# Patient Record
Sex: Male | Born: 1955 | Race: White | Hispanic: No | Marital: Single | Smoking: Former smoker
Health system: Southern US, Community
[De-identification: ages and names within clinical notes are randomized; demographics above are authoritative.]

## PROBLEM LIST (undated history)

## (undated) DIAGNOSIS — N200 Calculus of kidney: Secondary | ICD-10-CM

## (undated) DIAGNOSIS — E7800 Pure hypercholesterolemia, unspecified: Secondary | ICD-10-CM

## (undated) DIAGNOSIS — N400 Enlarged prostate without lower urinary tract symptoms: Secondary | ICD-10-CM

## (undated) DIAGNOSIS — E119 Type 2 diabetes mellitus without complications: Secondary | ICD-10-CM

---

## 2012-08-30 ENCOUNTER — Encounter (HOSPITAL_COMMUNITY): Payer: Self-pay | Admitting: Family Medicine

## 2012-08-30 ENCOUNTER — Emergency Department (HOSPITAL_COMMUNITY)
Admission: EM | Admit: 2012-08-30 | Discharge: 2012-08-30 | Disposition: A | Discharge status: Home / Self Care | Payer: Self-pay | Attending: Emergency Medicine | Admitting: Emergency Medicine

## 2012-08-30 ENCOUNTER — Emergency Department (HOSPITAL_COMMUNITY): Payer: Self-pay

## 2012-08-30 DIAGNOSIS — W108XXA Fall (on) (from) other stairs and steps, initial encounter: Secondary | ICD-10-CM | POA: Insufficient documentation

## 2012-08-30 DIAGNOSIS — Y929 Unspecified place or not applicable: Secondary | ICD-10-CM | POA: Insufficient documentation

## 2012-08-30 DIAGNOSIS — S298XXA Other specified injuries of thorax, initial encounter: Secondary | ICD-10-CM | POA: Insufficient documentation

## 2012-08-30 DIAGNOSIS — Y939 Activity, unspecified: Secondary | ICD-10-CM | POA: Insufficient documentation

## 2012-08-30 DIAGNOSIS — Z79899 Other long term (current) drug therapy: Secondary | ICD-10-CM | POA: Insufficient documentation

## 2012-08-30 DIAGNOSIS — R0789 Other chest pain: Secondary | ICD-10-CM

## 2012-08-30 MED ORDER — CYCLOBENZAPRINE HCL 10 MG PO TABS: 10.0000 mg | ORAL_TABLET | Freq: Three times a day (TID) | ORAL | Status: DC | PRN

## 2012-08-30 MED ORDER — OXYCODONE-ACETAMINOPHEN 5-325 MG PO TABS: 1.0000 | ORAL_TABLET | ORAL | Status: DC | PRN

## 2012-08-30 NOTE — ED Notes (Signed)
Pt back from x-ray.  States pain improved w oxycodone he took from friend.

## 2012-08-30 NOTE — ED Provider Notes (Signed)
History     CSN: [id number]  Arrival date & time 08/30/12  1228   First MD Initiated Contact with Patient 08/30/12 1307      Chief Complaint  Patient presents with  . Rib Injury    (Consider location/radiation/quality/duration/timing/severity/associated sxs/prior treatment) Patient is a 57 y.o. male presenting with chest pain. The history is provided by the patient.  Chest Pain The chest pain began 1 - 2 weeks ago. The quality of the pain is described as sharp. Pertinent negatives for primary symptoms include no fever, no shortness of breath, no cough and no abdominal pain. Associated symptoms comments: He fell one week ago causing right sided chest wall pain that he states was significantly improved until yesterday when he sneezed and had sudden onset of recurrent sharp pain in the same region as the previous fall. No SOB, but reports it is painful to breathe. No fever, cough or hemoptysis. No abdominal pain.Marland Kitchen     History reviewed. No pertinent past medical history.  History reviewed. No pertinent past surgical history.  History reviewed. No pertinent family history.  History  Substance Use Topics  . Smoking status: Never Smoker   . Smokeless tobacco: Not on file  . Alcohol Use: No      Review of Systems  Constitutional: Negative for fever.  HENT: Negative for neck pain.   Respiratory: Negative for cough and shortness of breath.   Cardiovascular: Positive for chest pain.  Gastrointestinal: Negative for abdominal pain.    Allergies  Review of patient's allergies indicates no known allergies.  Home Medications   Current Outpatient Rx  Name  Route  Sig  Dispense  Refill  . CHROMIUM PO   Oral   Take 1 tablet by mouth daily.         Marland Kitchen GINSENG PO   Oral   Take 1 capsule by mouth daily.         Marland Kitchen HYDROXYCHLOROQUINE SULFATE 200 MG PO TABS   Oral   Take 200 mg by mouth daily.         Marland Kitchen METFORMIN HCL 500 MG PO TABS   Oral   Take 250 mg by mouth every  evening.         Marland Kitchen MILK THISTLE PO   Oral   Take 1 capsule by mouth daily.         . ADULT MULTIVITAMIN W/MINERALS CH   Oral   Take 1 tablet by mouth daily.         Marland Kitchen FISH OIL PO   Oral   Take 1 capsule by mouth daily.         . OXYCODONE HCL PO   Oral   Take 1 tablet by mouth once.           BP 161/93  Pulse 102  Temp 98 F (36.7 C)  Resp 18  SpO2 96%  Physical Exam  Constitutional: He is oriented to person, place, and time. He appears well-developed and well-nourished.  HENT:  Head: Normocephalic.  Neck: Normal range of motion. Neck supple.  Cardiovascular: Normal rate and regular rhythm.   Pulmonary/Chest: Effort normal and breath sounds normal. He has no wheezes. He has no rales. He exhibits tenderness.       Chest wall unremarkable in appearance without bruising or swelling. Mildly tender over right anterior and lateral chest wall. No bony deformity.   Abdominal: Soft. Bowel sounds are normal. There is no tenderness. There is no rebound and no guarding.  Musculoskeletal: Normal range of motion.  Neurological: He is alert and oriented to person, place, and time.  Skin: Skin is warm and dry. No rash noted.  Psychiatric: He has a normal mood and affect.    ED Course  Procedures (including critical care time)  Labs Reviewed - No data to display Dg Ribs Unilateral W/chest Right  08/30/2012  *RADIOLOGY REPORT*  Clinical Data: Larey Seat 1 week ago.  Chest pain  RIGHT RIBS AND CHEST - 3+ VIEW  Comparison: None  Findings: The lungs are clear without infiltrate or effusion.  No pneumothorax.  Negative for heart failure.  Negative for right rib fracture.  IMPRESSION: No acute abnormality.   Original Report Authenticated By: Janeece Riggers, M.D.      No diagnosis found.   1. Chest wall pain  MDM  Chest x-ray negative for any rib fractures or lung abnormalities. Abdomen completely nontender. Will treat for muscular chest wall pain.        Arnoldo Hooker,  PA-C 08/30/12 1442

## 2012-08-30 NOTE — ED Provider Notes (Signed)
Medical screening examination/treatment/procedure(s) were performed by non-physician practitioner and as supervising physician I was immediately available for consultation/collaboration.   Dione Booze, MD 08/30/12 1459

## 2012-08-30 NOTE — ED Notes (Signed)
Per pt sts fell 1 weeks ago and injured back. sts was getting better and this am he sneezed and felt right rib pain. Very painful with breathing and movement.

## 2012-08-30 NOTE — ED Notes (Signed)
Pt is worried about having punctured a lung.  Fell on stairs last week, but today when sneezed felt intense pain and pain on inspiration.  Lung sounds clear bil.  No crepitus noted.

## 2014-10-14 ENCOUNTER — Other Ambulatory Visit: Payer: Self-pay | Admitting: Allergy and Immunology

## 2014-10-14 DIAGNOSIS — R0981 Nasal congestion: Secondary | ICD-10-CM

## 2014-10-14 DIAGNOSIS — R0982 Postnasal drip: Secondary | ICD-10-CM

## 2014-10-18 ENCOUNTER — Ambulatory Visit
Admission: RE | Admit: 2014-10-18 | Discharge: 2014-10-18 | Disposition: A | Discharge status: Home / Self Care | Payer: BLUE CROSS/BLUE SHIELD | Source: Ambulatory Visit | Attending: Allergy and Immunology | Admitting: Allergy and Immunology

## 2014-10-18 DIAGNOSIS — R0982 Postnasal drip: Secondary | ICD-10-CM

## 2014-10-18 DIAGNOSIS — R0981 Nasal congestion: Secondary | ICD-10-CM

## 2016-07-01 ENCOUNTER — Emergency Department (HOSPITAL_COMMUNITY): Payer: BLUE CROSS/BLUE SHIELD

## 2016-07-01 ENCOUNTER — Emergency Department (HOSPITAL_COMMUNITY)
Admission: EM | Admit: 2016-07-01 | Discharge: 2016-07-02 | Disposition: A | Discharge status: Home / Self Care | Payer: BLUE CROSS/BLUE SHIELD | Attending: Emergency Medicine | Admitting: Emergency Medicine

## 2016-07-01 ENCOUNTER — Encounter (HOSPITAL_COMMUNITY): Payer: Self-pay

## 2016-07-01 DIAGNOSIS — R0989 Other specified symptoms and signs involving the circulatory and respiratory systems: Secondary | ICD-10-CM | POA: Diagnosis present

## 2016-07-01 DIAGNOSIS — F458 Other somatoform disorders: Secondary | ICD-10-CM | POA: Insufficient documentation

## 2016-07-01 DIAGNOSIS — R198 Other specified symptoms and signs involving the digestive system and abdomen: Secondary | ICD-10-CM

## 2016-07-01 NOTE — ED Triage Notes (Signed)
Pt states accidentally swallowed tab to can of beer. Pt denies any attempts at harm. States tab fell into can, finished beer and realized he had swallowed top. Pt states feeling of something in throat, denies any pain or bleeding.

## 2016-07-01 NOTE — ED Provider Notes (Signed)
[facility]-EMERGENCY DEPT Provider Note   CSN: [id number] Arrival date & time: 07/01/16  2304     History   Chief Complaint Chief Complaint  Patient presents with  . Foreign Body    HPI Lonnie Bowen is a 60 y.o. male.  HPI Patient presents with swallowed foreign body.  Patient states he was drinking beer out of a glass approximately 11:30 PM when he thinks he accidentally swallowed the beer tab.  The beer tab was not on the can and he did not see the tab in the glass, but states that "something just didn't feel right" when he took a gulp.  He attempted to make himself vomit by putting his finger down his throat.  He states he was able to eat chili afterwards without difficulty.  He states now he just has a weird sensation in his throat. He denies any difficulty swallowing, choking, coughing, CP, SOB, or abdominal pain.  Nothing makes his symptoms better or worse.    History reviewed. No pertinent past medical history.  There are no active problems to display for this patient.   History reviewed. No pertinent surgical history.    Home Medications    Prior to Admission medications   Medication Sig Start Date End Date Taking? Authorizing Provider  CHROMIUM PO Take 1 tablet by mouth daily.    Historical Provider, MD  cyclobenzaprine (FLEXERIL) 10 MG tablet Take 1 tablet (10 mg total) by mouth 3 (three) times daily as needed for muscle spasms. 08/30/12   Shari Upstill, PA-C  GINSENG PO Take 1 capsule by mouth daily.    Historical Provider, MD  hydroxychloroquine (PLAQUENIL) 200 MG tablet Take 200 mg by mouth daily.    Historical Provider, MD  metFORMIN (GLUCOPHAGE) 500 MG tablet Take 250 mg by mouth every evening.    Historical Provider, MD  MILK THISTLE PO Take 1 capsule by mouth daily.    Historical Provider, MD  Multiple Vitamin (MULTIVITAMIN WITH MINERALS) TABS Take 1 tablet by mouth daily.    Historical Provider, MD  Omega-3 Fatty Acids (FISH OIL PO) Take 1 capsule by mouth  daily.    Historical Provider, MD  OXYCODONE HCL PO Take 1 tablet by mouth once.    Historical Provider, MD  oxyCODONE-acetaminophen (PERCOCET/ROXICET) 5-325 MG per tablet Take 1 tablet by mouth every 4 (four) hours as needed for pain. 08/30/12   Elpidio Anis, PA-C    Family History History reviewed. No pertinent family history.  Social History Social History  Substance Use Topics  . Smoking status: Never Smoker  . Smokeless tobacco: Never Used  . Alcohol use No     Allergies   Patient has no known allergies.   Review of Systems Review of Systems All other systems negative unless otherwise stated in HPI   Physical Exam Updated Vital Signs BP (!) 159/113 (BP Location: Left Arm)   Pulse 108   Temp 97.8 F (36.6 C)   Resp 16   SpO2 95%   Physical Exam  Constitutional: He is oriented to person, place, and time. He appears well-developed and well-nourished.  Non-toxic appearance. He does not have a sickly appearance. He does not appear ill.  HENT:  Head: Normocephalic and atraumatic.  Mouth/Throat: Oropharynx is clear and moist.  No drooling, tolerating secretions without difficulty.   Eyes: Conjunctivae are normal.  Neck: Normal range of motion and phonation normal. Neck supple.  Cardiovascular: Regular rhythm.  Tachycardia present.   Pulmonary/Chest: Effort normal and breath sounds normal.  No accessory muscle usage or stridor. No respiratory distress. He has no wheezes. He has no rhonchi. He has no rales.  Abdominal: Soft. Bowel sounds are normal. He exhibits no distension. There is no tenderness.  Musculoskeletal: Normal range of motion.  Lymphadenopathy:    He has no cervical adenopathy.  Neurological: He is alert and oriented to person, place, and time.  Speech clear without dysarthria.  Skin: Skin is warm and dry.  Psychiatric: He has a normal mood and affect. His behavior is normal.     ED Treatments / Results  Labs (all labs ordered are listed, but only  abnormal results are displayed) Labs Reviewed - No data to display  EKG  EKG Interpretation  Date/Time:  Monday July 01 2016 23:37:49 EST Ventricular Rate:  106 PR Interval:    QRS Duration: 76 QT Interval:  334 QTC Calculation: 444 R Axis:   26 Text Interpretation:  Sinus tachycardia Abnormal R-wave progression, early transition No old tracing to compare Confirmed by WARD,  DO, KRISTEN ([id number]) on 07/02/2016 12:15:19 AM       Radiology Dg Neck Soft Tissue  Result Date: 07/02/2016 CLINICAL DATA:  Swallowed tab to a can of beer EXAM: NECK SOFT TISSUES - 1+ VIEW COMPARISON:  None. FINDINGS: Single lateral view of the neck soft tissues demonstrates straightening of the cervical spine. There are degenerative changes at C3-C4, C4-C5, C5-C6 and C6-C7. Prevertebral soft tissue thickness appears within normal limits. No radiopaque foreign body is visualized. IMPRESSION: Degenerative changes of the cervical spine. No radiopaque foreign body is visualized. Electronically Signed   By: Jasmine PangKim  Fujinaga M.D.   On: 07/02/2016 00:15   Dg Abdomen Acute W/chest  Result Date: 07/02/2016 CLINICAL DATA:  Accidentally swallowed tab to can of beer EXAM: DG ABDOMEN ACUTE W/ 1V CHEST COMPARISON:  08/30/2012 FINDINGS: Single-view chest demonstrates no focal pulmonary infiltrate, consolidation, or pleural effusion. Cardiomediastinal silhouette within normal limits. No pneumothorax. Supine view of the abdomen demonstrates a normal bowel gas pattern. There is no radiopaque foreign body identified. IMPRESSION: Negative abdominal radiographs.  No acute cardiopulmonary disease. Electronically Signed   By: Jasmine PangKim  Fujinaga M.D.   On: 07/02/2016 00:17    Procedures Procedures (including critical care time)  Medications Ordered in ED Medications - No data to display   Initial Impression / Assessment and Plan / ED Course  I have reviewed the triage vital signs and the nursing notes.  Pertinent labs & imaging  results that were available during my care of the patient were reviewed by me and considered in my medical decision making (see chart for details).  Clinical Course    Patient presents with possible swallowed beer tab.  He has been able to eat since the event.  He is in no distress and tolerating secretions without difficulty.  Plain films of his neck, chest, and abdomen obtained and showed no evidence of FB.  Patient continues to be asymptomatic.  Patient with mild tachycardia, I suspect related to anxiety, EKG sinus tachycardia.  Patient given follow up with GI for persistent symptoms.  Return precaution discussed including hematemesis, melena/hematochezia, choking/drooling, or severe abdominal pain.  Stable for discharge.   Case has been discussed with Dr. Elesa MassedWard who agrees with the above plan for discharge.    Final Clinical Impressions(s) / ED Diagnoses   Final diagnoses:  Globus sensation    New Prescriptions New Prescriptions   No medications on file     Cheri FowlerKayla Herminio Kniskern, PA-C 07/02/16 0033  Layla MawKristen N Ward, DO 07/02/16 [phone]

## 2016-07-02 NOTE — Discharge Instructions (Signed)
We do not see any evidence of a swallowed beer tab on your plain films today.  Follow up with the gastroenterologist if your symptoms persist.  Return to the ED for choking, excessive drooling, severe abdominal pain, bloody vomitus, bloody stools, or any new or concerning symptoms.

## 2018-03-16 ENCOUNTER — Other Ambulatory Visit: Payer: Self-pay

## 2018-03-16 ENCOUNTER — Encounter (HOSPITAL_BASED_OUTPATIENT_CLINIC_OR_DEPARTMENT_OTHER): Payer: Self-pay

## 2018-03-16 ENCOUNTER — Emergency Department (HOSPITAL_BASED_OUTPATIENT_CLINIC_OR_DEPARTMENT_OTHER): Payer: Self-pay

## 2018-03-16 ENCOUNTER — Emergency Department (HOSPITAL_BASED_OUTPATIENT_CLINIC_OR_DEPARTMENT_OTHER)
Admission: EM | Admit: 2018-03-16 | Discharge: 2018-03-16 | Disposition: A | Discharge status: Home / Self Care | Payer: Self-pay | Attending: Emergency Medicine | Admitting: Emergency Medicine

## 2018-03-16 DIAGNOSIS — E7800 Pure hypercholesterolemia, unspecified: Secondary | ICD-10-CM | POA: Insufficient documentation

## 2018-03-16 DIAGNOSIS — Z79899 Other long term (current) drug therapy: Secondary | ICD-10-CM | POA: Insufficient documentation

## 2018-03-16 DIAGNOSIS — R05 Cough: Secondary | ICD-10-CM | POA: Insufficient documentation

## 2018-03-16 DIAGNOSIS — R5383 Other fatigue: Secondary | ICD-10-CM | POA: Insufficient documentation

## 2018-03-16 DIAGNOSIS — R059 Cough, unspecified: Secondary | ICD-10-CM

## 2018-03-16 DIAGNOSIS — R1084 Generalized abdominal pain: Secondary | ICD-10-CM | POA: Insufficient documentation

## 2018-03-16 DIAGNOSIS — E119 Type 2 diabetes mellitus without complications: Secondary | ICD-10-CM | POA: Insufficient documentation

## 2018-03-16 DIAGNOSIS — Z87891 Personal history of nicotine dependence: Secondary | ICD-10-CM | POA: Insufficient documentation

## 2018-03-16 HISTORY — DX: Calculus of kidney: N20.0

## 2018-03-16 HISTORY — DX: Pure hypercholesterolemia, unspecified: E78.00

## 2018-03-16 HISTORY — DX: Benign prostatic hyperplasia without lower urinary tract symptoms: N40.0

## 2018-03-16 HISTORY — DX: Type 2 diabetes mellitus without complications: E11.9

## 2018-03-16 LAB — CBC
HEMATOCRIT: 46.3 % (ref 39.0–52.0)
Hemoglobin: 16.7 g/dL (ref 13.0–17.0)
MCH: 30.6 pg (ref 26.0–34.0)
MCHC: 36.1 g/dL — ABNORMAL HIGH (ref 30.0–36.0)
MCV: 84.8 fL (ref 78.0–100.0)
Platelets: 186 10*3/uL (ref 150–400)
RBC: 5.46 MIL/uL (ref 4.22–5.81)
RDW: 12.1 % (ref 11.5–15.5)
WBC: 9.2 10*3/uL (ref 4.0–10.5)

## 2018-03-16 LAB — URINALYSIS, MICROSCOPIC (REFLEX)

## 2018-03-16 LAB — URINALYSIS, ROUTINE W REFLEX MICROSCOPIC
Bilirubin Urine: NEGATIVE
Glucose, UA: NEGATIVE mg/dL
Ketones, ur: NEGATIVE mg/dL
LEUKOCYTES UA: NEGATIVE
Nitrite: NEGATIVE
PH: 5.5 (ref 5.0–8.0)
Protein, ur: NEGATIVE mg/dL
Specific Gravity, Urine: 1.015 (ref 1.005–1.030)

## 2018-03-16 LAB — LIPASE, BLOOD: Lipase: 36 U/L (ref 11–51)

## 2018-03-16 LAB — COMPREHENSIVE METABOLIC PANEL
ALT: 21 U/L (ref 0–44)
AST: 23 U/L (ref 15–41)
Albumin: 4.4 g/dL (ref 3.5–5.0)
Alkaline Phosphatase: 42 U/L (ref 38–126)
Anion gap: 11 (ref 5–15)
BILIRUBIN TOTAL: 0.8 mg/dL (ref 0.3–1.2)
BUN: 25 mg/dL — AB (ref 8–23)
CO2: 26 mmol/L (ref 22–32)
CREATININE: 1.3 mg/dL — AB (ref 0.61–1.24)
Calcium: 9.9 mg/dL (ref 8.9–10.3)
Chloride: 100 mmol/L (ref 98–111)
GFR calc Af Amer: >60 mL/min (ref >60)
GFR, EST NON AFRICAN AMERICAN: 58 mL/min — AB (ref >60)
Glucose, Bld: 127 mg/dL — ABNORMAL HIGH (ref 70–99)
POTASSIUM: 4.5 mmol/L (ref 3.5–5.1)
Sodium: 137 mmol/L (ref 135–145)
Total Protein: 7.4 g/dL (ref 6.5–8.1)

## 2018-03-16 NOTE — ED Notes (Signed)
ED Provider at bedside. 

## 2018-03-16 NOTE — Discharge Instructions (Signed)
Please read and follow all provided instructions.  Your diagnoses today include:  1. Generalized abdominal pain   2. Cough   3. Fatigue, unspecified type     Tests performed today include:  Blood counts and electrolytes  Blood tests to check liver and kidney function  Blood tests to check pancreas function  Urine test to look for infection  Chest x-ray - lungs are clear  Vital signs. See below for your results today.   Medications prescribed:   None  Take any prescribed medications only as directed.  Home care instructions:   Follow any educational materials contained in this packet.  Follow-up instructions: Please follow-up with your urologist as planned for further evaluation of your symptoms.    Return instructions:  SEEK IMMEDIATE MEDICAL ATTENTION IF:  The pain does not go away or becomes severe   A temperature above 101F develops   Repeated vomiting occurs (multiple episodes)   The pain becomes localized to portions of the abdomen. The right side could possibly be appendicitis. In an adult, the left lower portion of the abdomen could be colitis or diverticulitis.   Blood is being passed in stools or vomit (bright red or black tarry stools)   You develop chest pain, difficulty breathing, dizziness or fainting, or become confused, poorly responsive, or inconsolable (young children)  If you have any other emergent concerns regarding your health  Additional Information: Abdominal (belly) pain can be caused by many things. Your caregiver performed an examination and possibly ordered blood/urine tests and imaging (CT scan, x-rays, ultrasound). Many cases can be observed and treated at home after initial evaluation in the emergency department. Even though you are being discharged home, abdominal pain can be unpredictable. Therefore, you need a repeated exam if your pain does not resolve, returns, or worsens. Most patients with abdominal pain don't have to be  admitted to the hospital or have surgery, but serious problems like appendicitis and gallbladder attacks can start out as nonspecific pain. Many abdominal conditions cannot be diagnosed in one visit, so follow-up evaluations are very important.  Your vital signs today were: BP 129/87    Pulse 92    Temp 98.1 F (36.7 C) (Oral)    Resp 18    Ht 5\' 11"  (1.803 m)    Wt 85.7 kg (189 lb)    SpO2 97%    BMI 26.36 kg/m  If your blood pressure (bp) was elevated above 135/85 this visit, please have this repeated by your doctor within one month. --------------

## 2018-03-16 NOTE — ED Notes (Signed)
Patient transported to X-ray 

## 2018-03-16 NOTE — ED Triage Notes (Signed)
C/o abd pain and gas x 2-3 days after starting cipro and flomax for enlarged prostate-also c/o fatigue x 2-3 weeks -NAD-steady gait

## 2018-03-16 NOTE — ED Provider Notes (Signed)
[facility] EMERGENCY DEPARTMENT Provider Note   CSN: [id number] Arrival date & time: 03/16/18  1435     History   Chief Complaint Chief Complaint  Patient presents with  . Abdominal Pain    HPI Lonnie Bowen is a 62 y.o. male.  Patient recently seen by his primary care doctor for enlarged prostate, subsequently started on Cipro, presents with complaint of abdominal pain, productive cough, and fatigue.  Patient states that he called his doctor today and was referred to the emergency department.  After being started on the ciprofloxacin, patient developed abdominal cramping, bloating, gas type symptoms.  He then stopped this medication which helped symptoms improve.  He did not have profuse diarrhea.  Symptoms concerning for enlarged prostate have improved somewhat.  Patient has an appointment scheduled with his urologist in the near future.  Patient reports fatigue over the past several weeks.  He has also had a cough productive of nonbloody sputum at times.  No chest pains.  No wheezing.  He has an occasional episode of mild shortness of breath.  He has a history of seasonal allergies and thought this might be causing the symptoms. The onset of this condition was acute. The course is constant. Aggravating factors: none. Alleviating factors: none.       Past Medical History:  Diagnosis Date  . Diabetes mellitus without complication (HCC)   . Enlarged prostate   . High cholesterol   . Kidney stone     There are no active problems to display for this patient.   History reviewed. No pertinent surgical history.      Home Medications    Prior to Admission medications   Medication Sig Start Date End Date Taking? Authorizing Provider  CHROMIUM PO Take 1 tablet by mouth daily.    [provider]  cyclobenzaprine (FLEXERIL) 10 MG tablet Take 1 tablet (10 mg total) by mouth 3 (three) times daily as needed for muscle spasms. 08/30/12   Upstill, Melvenia Beam, PA-C    GINSENG PO Take 1 capsule by mouth daily.    [provider]  hydroxychloroquine (PLAQUENIL) 200 MG tablet Take 200 mg by mouth daily.    [provider]  metFORMIN (GLUCOPHAGE) 500 MG tablet Take 250 mg by mouth every evening.    [provider]  MILK THISTLE PO Take 1 capsule by mouth daily.    [provider]  Multiple Vitamin (MULTIVITAMIN WITH MINERALS) TABS Take 1 tablet by mouth daily.    [provider]  Omega-3 Fatty Acids (FISH OIL PO) Take 1 capsule by mouth daily.    [provider]  OXYCODONE HCL PO Take 1 tablet by mouth once.    [provider]  oxyCODONE-acetaminophen (PERCOCET/ROXICET) 5-325 MG per tablet Take 1 tablet by mouth every 4 (four) hours as needed for pain. 08/30/12   Elpidio Anis, PA-C    Family History No family history on file.  Social History Social History   Tobacco Use  . Smoking status: Former Games developer  . Smokeless tobacco: Never Used  Substance Use Topics  . Alcohol use: Yes    Comment: rare  . Drug use: Yes    Types: Marijuana     Allergies   Patient has no known allergies.   Review of Systems Review of Systems  Constitutional: Positive for fatigue. Negative for diaphoresis and fever.  Eyes: Negative for redness.  Respiratory: Positive for cough. Negative for shortness of breath.   Cardiovascular: Negative for chest pain,  palpitations and leg swelling.  Gastrointestinal: Positive for abdominal pain (improved) and nausea. Negative for constipation, diarrhea and vomiting.  Genitourinary: Negative for dysuria.  Musculoskeletal: Negative for back pain and neck pain.  Skin: Negative for rash.  Neurological: Negative for syncope and light-headedness.  Psychiatric/Behavioral: The patient is not nervous/anxious.      Physical Exam Updated Vital Signs BP (!) 158/94   Pulse (!) 102   Temp 98.1 F (36.7 C) (Oral)   Resp 18   Ht 5\' 11"  (1.803 m)   Wt 85.7 kg (189 lb)   SpO2  95%   BMI 26.36 kg/m   Physical Exam  Constitutional: He appears well-developed and well-nourished.  HENT:  Head: Normocephalic and atraumatic.  Eyes: Conjunctivae are normal. Right eye exhibits no discharge. Left eye exhibits no discharge.  Neck: Normal range of motion. Neck supple.  Cardiovascular: Normal rate, regular rhythm and normal heart sounds.  Pulmonary/Chest: Effort normal and breath sounds normal. No stridor. He has no wheezes. He has no rales.  Abdominal: Soft. There is no tenderness. There is no rebound and no guarding.  Neurological: He is alert.  Skin: Skin is warm and dry.  Psychiatric: He has a normal mood and affect.  Nursing note and vitals reviewed.    ED Treatments / Results  Labs (all labs ordered are listed, but only abnormal results are displayed) Labs Reviewed  COMPREHENSIVE METABOLIC PANEL - Abnormal; Notable for the following components:      Result Value   Glucose, Bld 127 (*)    BUN 25 (*)    Creatinine, Ser 1.30 (*)    GFR calc non Af Amer 58 (*)    All other components within normal limits  CBC - Abnormal; Notable for the following components:   MCHC 36.1 (*)    All other components within normal limits  URINALYSIS, ROUTINE W REFLEX MICROSCOPIC - Abnormal; Notable for the following components:   Hgb urine dipstick TRACE (*)    All other components within normal limits  URINALYSIS, MICROSCOPIC (REFLEX) - Abnormal; Notable for the following components:   Bacteria, UA RARE (*)    All other components within normal limits  LIPASE, BLOOD    EKG None  Radiology No results found.  Procedures Procedures (including critical care time)  Medications Ordered in ED Medications - No data to display   Initial Impression / Assessment and Plan / ED Course  I have reviewed the triage vital signs and the nursing notes.  Pertinent labs & imaging results that were available during my care of the patient were reviewed by me and considered in my  medical decision making (see chart for details).     Patient seen and examined.  We discussed results of lab work and urine test today.  Discussed that I have no acute reasons to restart him on the ciprofloxacin.  Given benign abdominal exam and reassuring lab work, no indication for further imaging at this time.  Patient will monitor his symptoms closely and follow-up with his urologist.  We will obtain an x-ray of the chest to ensure no underlying infection which may be causing fatigue.  If this is reassuring, patient may be discharged home with follow-up as planned.  Vital signs reviewed and are as follows: BP (!) 158/94   Pulse (!) 102   Temp 98.1 F (36.7 C) (Oral)   Resp 18   Ht 5\' 11"  (1.803 m)   Wt 85.7 kg (189 lb)   SpO2 95%  BMI 26.36 kg/m   5:56 PM chest x-ray is clear.  Patient updated on results.  He agrees with previously discussed plan.  He is ready for discharged home.  The patient was urged to return to the Emergency Department immediately with worsening of current symptoms, worsening abdominal pain, persistent vomiting, blood noted in stools, fever, or any other concerns. The patient verbalized understanding.    Final Clinical Impressions(s) / ED Diagnoses   Final diagnoses:  Generalized abdominal pain  Cough  Fatigue, unspecified type   Abd pain: Patient with abdominal pain, improving after d/c of Cipro. Suspect GI side affect from abx.  No signs of C. Diff. Vitals are stable, no fever. Labs reassuring. No signs of dehydration, patient is tolerating PO's. Lungs are clear and no signs suggestive of PNA.  Chest x-ray is clear.  Check to due to cough and fatigue.  Low concern for appendicitis, cholecystitis, pancreatitis, ruptured viscus, UTI, kidney stone, aortic dissection, aortic aneurysm or other emergent abdominal etiology. Supportive therapy indicated with return if symptoms worsen.  No anemia.  No sign of UTI.  Doubt prostatitis based on history.   ED  Discharge Orders    None       Renne CriglerGeiple, Inanna Telford, Cordelia Poche-C 03/16/18 Orpah [id number]    Goldston, Scott, MD 03/17/18 [phone]

## 2018-03-16 NOTE — ED Notes (Signed)
Pt verbalizes understanding of d/c instructions and denies any further needs at this time. 

## 2019-02-19 IMAGING — CR DG CHEST 2V
2 series · 2 of 2 positions shown · non-contrast
Comparison: July 01, 2016

CLINICAL DATA: Cough.

EXAM:
CHEST - 2 VIEW

[w chest pa]
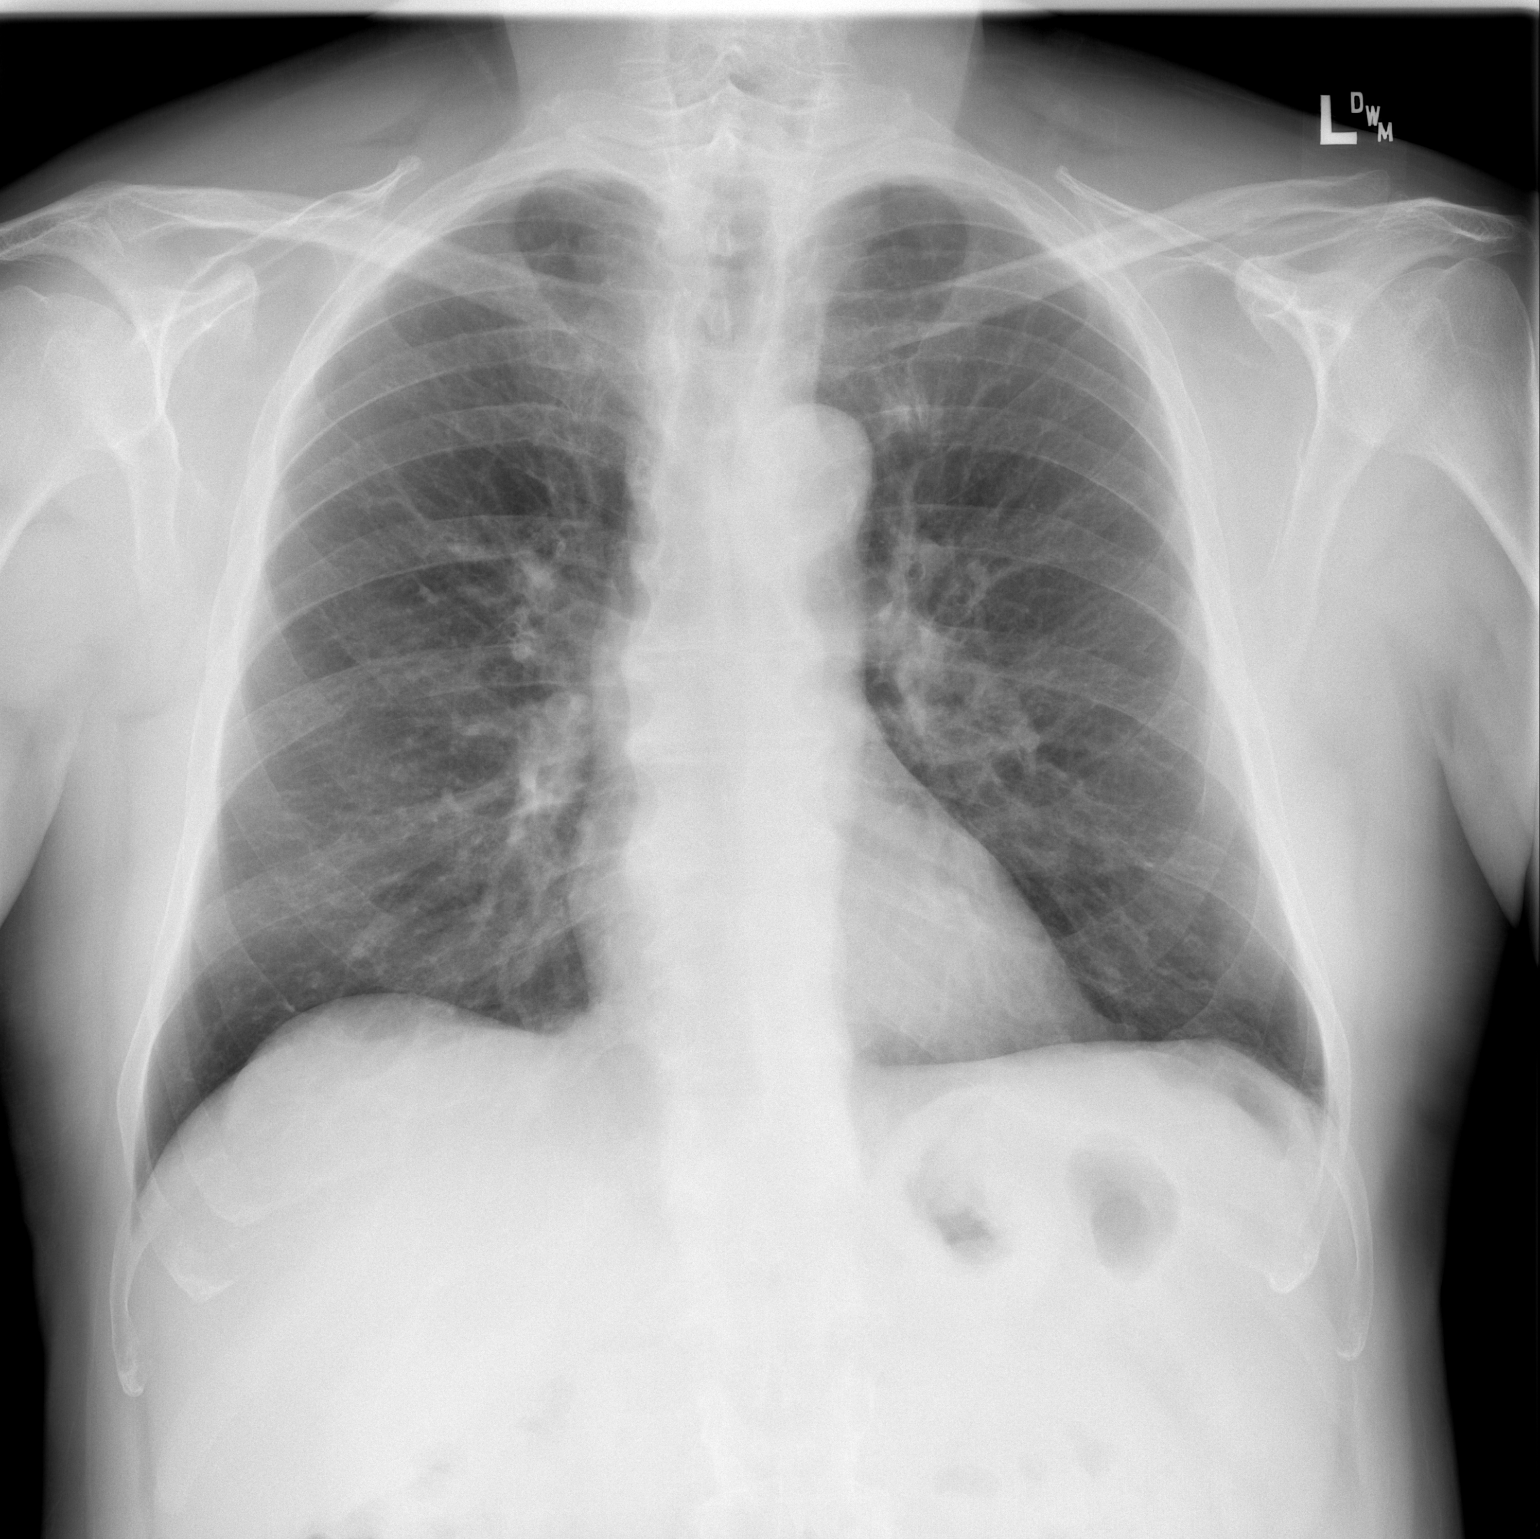

[w chest lat]
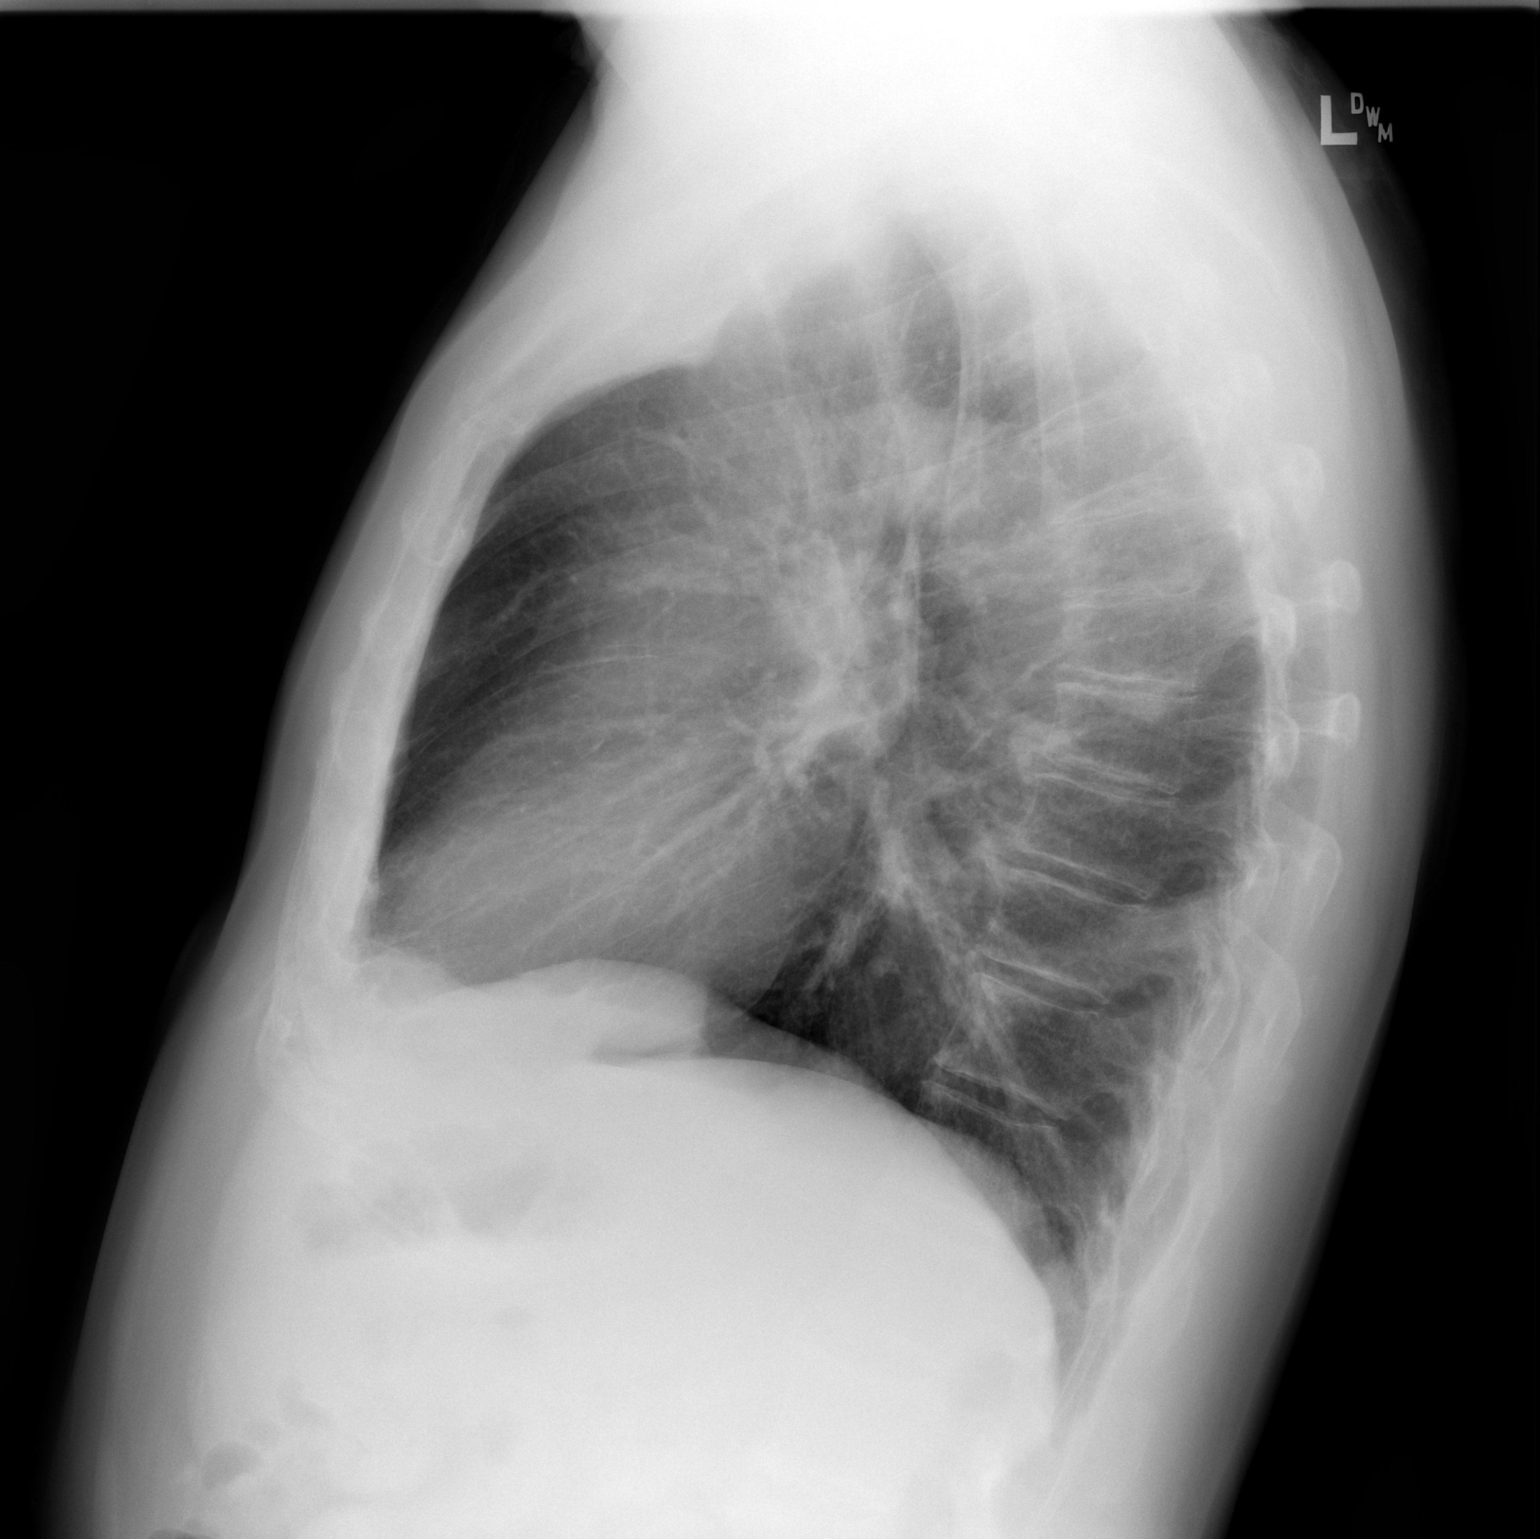

[2 of 2 positions shown; findings below may reference images not displayed]

FINDINGS: The heart size and mediastinal contours are within normal limits.
Both lungs are clear. The visualized skeletal structures are
unremarkable.
IMPRESSION: No active cardiopulmonary disease.

## 2019-06-24 ENCOUNTER — Ambulatory Visit (HOSPITAL_COMMUNITY)
Admission: RE | Admit: 2019-06-24 | Discharge: 2019-06-24 | Disposition: A | Discharge status: Home / Self Care | Payer: Self-pay | Source: Ambulatory Visit | Attending: Physician Assistant | Admitting: Physician Assistant

## 2019-06-24 ENCOUNTER — Encounter (HOSPITAL_COMMUNITY): Payer: Self-pay

## 2019-06-24 ENCOUNTER — Other Ambulatory Visit (HOSPITAL_COMMUNITY): Payer: Self-pay | Admitting: Physician Assistant

## 2019-06-24 ENCOUNTER — Other Ambulatory Visit: Payer: Self-pay

## 2019-06-24 DIAGNOSIS — M79604 Pain in right leg: Secondary | ICD-10-CM

## 2019-06-24 DIAGNOSIS — M7989 Other specified soft tissue disorders: Secondary | ICD-10-CM

## 2019-06-24 NOTE — Progress Notes (Signed)
VASCULAR LAB PRELIMINARY  PRELIMINARY  PRELIMINARY  PRELIMINARY  Right lower extremity venous duplex completed.    Preliminary report:  See CV proc for preliminary results.  Called report to [employer].  Olly Shiner, RVT 06/24/2019, 2:07 PM

## 2024-02-11 ENCOUNTER — Emergency Department (HOSPITAL_COMMUNITY)

## 2024-02-11 ENCOUNTER — Other Ambulatory Visit: Payer: Self-pay

## 2024-02-11 ENCOUNTER — Inpatient Hospital Stay (HOSPITAL_COMMUNITY)
Admission: EM | Admit: 2024-02-11 | Discharge: 2024-02-13 | DRG: 811 | Disposition: A | Discharge status: Home / Self Care | Attending: Internal Medicine | Admitting: Internal Medicine

## 2024-02-11 ENCOUNTER — Encounter (HOSPITAL_COMMUNITY): Payer: Self-pay

## 2024-02-11 DIAGNOSIS — K2270 Barrett's esophagus without dysplasia: Secondary | ICD-10-CM | POA: Diagnosis present

## 2024-02-11 DIAGNOSIS — Z860100 Personal history of colon polyps, unspecified: Secondary | ICD-10-CM | POA: Diagnosis not present

## 2024-02-11 DIAGNOSIS — K625 Hemorrhage of anus and rectum: Secondary | ICD-10-CM | POA: Diagnosis present

## 2024-02-11 DIAGNOSIS — N400 Enlarged prostate without lower urinary tract symptoms: Secondary | ICD-10-CM | POA: Diagnosis present

## 2024-02-11 DIAGNOSIS — K2289 Other specified disease of esophagus: Secondary | ICD-10-CM | POA: Diagnosis present

## 2024-02-11 DIAGNOSIS — R0602 Shortness of breath: Secondary | ICD-10-CM | POA: Diagnosis not present

## 2024-02-11 DIAGNOSIS — T45525A Adverse effect of antithrombotic drugs, initial encounter: Secondary | ICD-10-CM | POA: Diagnosis present

## 2024-02-11 DIAGNOSIS — E119 Type 2 diabetes mellitus without complications: Secondary | ICD-10-CM | POA: Diagnosis present

## 2024-02-11 DIAGNOSIS — I2510 Atherosclerotic heart disease of native coronary artery without angina pectoris: Secondary | ICD-10-CM | POA: Diagnosis present

## 2024-02-11 DIAGNOSIS — I10 Essential (primary) hypertension: Secondary | ICD-10-CM | POA: Diagnosis present

## 2024-02-11 DIAGNOSIS — K5710 Diverticulosis of small intestine without perforation or abscess without bleeding: Secondary | ICD-10-CM

## 2024-02-11 DIAGNOSIS — D62 Acute posthemorrhagic anemia: Secondary | ICD-10-CM | POA: Diagnosis present

## 2024-02-11 DIAGNOSIS — M069 Rheumatoid arthritis, unspecified: Secondary | ICD-10-CM | POA: Diagnosis present

## 2024-02-11 DIAGNOSIS — Z87891 Personal history of nicotine dependence: Secondary | ICD-10-CM | POA: Diagnosis not present

## 2024-02-11 DIAGNOSIS — Z79899 Other long term (current) drug therapy: Secondary | ICD-10-CM

## 2024-02-11 DIAGNOSIS — K2980 Duodenitis without bleeding: Secondary | ICD-10-CM | POA: Diagnosis present

## 2024-02-11 DIAGNOSIS — D649 Anemia, unspecified: Secondary | ICD-10-CM | POA: Diagnosis not present

## 2024-02-11 DIAGNOSIS — R000 Tachycardia, unspecified: Secondary | ICD-10-CM | POA: Diagnosis present

## 2024-02-11 DIAGNOSIS — R0600 Dyspnea, unspecified: Secondary | ICD-10-CM | POA: Diagnosis present

## 2024-02-11 DIAGNOSIS — E7800 Pure hypercholesterolemia, unspecified: Secondary | ICD-10-CM | POA: Diagnosis present

## 2024-02-11 DIAGNOSIS — Z7902 Long term (current) use of antithrombotics/antiplatelets: Secondary | ICD-10-CM

## 2024-02-11 DIAGNOSIS — K922 Gastrointestinal hemorrhage, unspecified: Secondary | ICD-10-CM | POA: Diagnosis not present

## 2024-02-11 DIAGNOSIS — N200 Calculus of kidney: Secondary | ICD-10-CM | POA: Diagnosis present

## 2024-02-11 DIAGNOSIS — Z7984 Long term (current) use of oral hypoglycemic drugs: Secondary | ICD-10-CM | POA: Diagnosis not present

## 2024-02-11 DIAGNOSIS — Z955 Presence of coronary angioplasty implant and graft: Secondary | ICD-10-CM | POA: Diagnosis not present

## 2024-02-11 DIAGNOSIS — K449 Diaphragmatic hernia without obstruction or gangrene: Secondary | ICD-10-CM | POA: Diagnosis present

## 2024-02-11 DIAGNOSIS — K5730 Diverticulosis of large intestine without perforation or abscess without bleeding: Secondary | ICD-10-CM

## 2024-02-11 DIAGNOSIS — K5751 Diverticulosis of both small and large intestine without perforation or abscess with bleeding: Secondary | ICD-10-CM | POA: Diagnosis present

## 2024-02-11 LAB — COMPREHENSIVE METABOLIC PANEL WITH GFR
ALT: 15 U/L (ref 0–44)
AST: 21 U/L (ref 15–41)
Albumin: 3.1 g/dL — ABNORMAL LOW (ref 3.5–5.0)
Alkaline Phosphatase: 40 U/L (ref 38–126)
Anion gap: 9 (ref 5–15)
BUN: 22 mg/dL (ref 8–23)
CO2: 25 mmol/L (ref 22–32)
Calcium: 9.5 mg/dL (ref 8.9–10.3)
Chloride: 106 mmol/L (ref 98–111)
Creatinine, Ser: 1.08 mg/dL (ref 0.61–1.24)
GFR, Estimated: >60 mL/min (ref >60)
Glucose, Bld: 161 mg/dL — ABNORMAL HIGH (ref 70–99)
Potassium: 4.3 mmol/L (ref 3.5–5.1)
Sodium: 140 mmol/L (ref 135–145)
Total Bilirubin: 0.6 mg/dL (ref 0.0–1.2)
Total Protein: 5.5 g/dL — ABNORMAL LOW (ref 6.5–8.1)

## 2024-02-11 LAB — CBC WITH DIFFERENTIAL/PLATELET
Abs Immature Granulocytes: 0.23 10*3/uL — ABNORMAL HIGH (ref 0.00–0.07)
Basophils Absolute: 0.1 10*3/uL (ref 0.0–0.1)
Basophils Relative: 1 %
Eosinophils Absolute: 0.4 10*3/uL (ref 0.0–0.5)
Eosinophils Relative: 4 %
HCT: 29.6 % — ABNORMAL LOW (ref 39.0–52.0)
Hemoglobin: 9.9 g/dL — ABNORMAL LOW (ref 13.0–17.0)
Immature Granulocytes: 2 %
Lymphocytes Relative: 21 %
Lymphs Abs: 2.2 10*3/uL (ref 0.7–4.0)
MCH: 32.2 pg (ref 26.0–34.0)
MCHC: 33.4 g/dL (ref 30.0–36.0)
MCV: 96.4 fL (ref 80.0–100.0)
Monocytes Absolute: 0.7 10*3/uL (ref 0.1–1.0)
Monocytes Relative: 7 %
Neutro Abs: 6.7 10*3/uL (ref 1.7–7.7)
Neutrophils Relative %: 65 %
Platelets: 316 10*3/uL (ref 150–400)
RBC: 3.07 MIL/uL — ABNORMAL LOW (ref 4.22–5.81)
RDW: 15.8 % — ABNORMAL HIGH (ref 11.5–15.5)
WBC: 10.4 10*3/uL (ref 4.0–10.5)
nRBC: 0 % (ref 0.0–0.2)

## 2024-02-11 LAB — GLUCOSE, CAPILLARY
Glucose-Capillary: 113 mg/dL — ABNORMAL HIGH (ref 70–99)
Glucose-Capillary: 145 mg/dL — ABNORMAL HIGH (ref 70–99)

## 2024-02-11 LAB — LIPID PANEL
Cholesterol: 92 mg/dL (ref 0–200)
HDL: 25 mg/dL — ABNORMAL LOW (ref >40)
LDL Cholesterol: 39 mg/dL (ref 0–99)
Total CHOL/HDL Ratio: 3.7 ratio
Triglycerides: 142 mg/dL (ref <150)
VLDL: 28 mg/dL (ref 0–40)

## 2024-02-11 LAB — ABO/RH: ABO/RH(D): A POS

## 2024-02-11 LAB — HEMOGLOBIN A1C
Hgb A1c MFr Bld: 6 % — ABNORMAL HIGH (ref 4.8–5.6)
Mean Plasma Glucose: 125.5 mg/dL

## 2024-02-11 LAB — PROTIME-INR
INR: 1.1 (ref 0.8–1.2)
Prothrombin Time: 14.3 s (ref 11.4–15.2)

## 2024-02-11 LAB — HEMOGLOBIN AND HEMATOCRIT, BLOOD
HCT: 22.4 % — ABNORMAL LOW (ref 39.0–52.0)
HCT: 20.9 % — ABNORMAL LOW (ref 39.0–52.0)
Hemoglobin: 7.7 g/dL — ABNORMAL LOW (ref 13.0–17.0)
Hemoglobin: 7.1 g/dL — ABNORMAL LOW (ref 13.0–17.0)

## 2024-02-11 LAB — HIV ANTIBODY (ROUTINE TESTING W REFLEX): HIV Screen 4th Generation wRfx: NONREACTIVE

## 2024-02-11 LAB — TSH: TSH: 0.782 u[IU]/mL (ref 0.350–4.500)

## 2024-02-11 MED ORDER — LACTATED RINGERS IV BOLUS
1000.0000 mL | Freq: Once | INTRAVENOUS | Status: AC
  Administered 2024-02-11: 1000 mL via INTRAVENOUS

## 2024-02-11 MED ORDER — INSULIN ASPART 100 UNIT/ML IJ SOLN
0.0000 [IU] | Freq: Three times a day (TID) | INTRAMUSCULAR | Status: DC
  Administered 2024-02-12: 2 [IU] via SUBCUTANEOUS
  Administered 2024-02-12: 3 [IU] via SUBCUTANEOUS
  Administered 2024-02-13: 2 [IU] via SUBCUTANEOUS

## 2024-02-11 MED ORDER — ADULT MULTIVITAMIN W/MINERALS CH
1.0000 | ORAL_TABLET | Freq: Every day | ORAL | Status: DC
  Administered 2024-02-11 – 2024-02-13 (×2): 1 via ORAL
  Filled 2024-02-11 (×2): qty 1

## 2024-02-11 MED ORDER — AMLODIPINE BESYLATE 5 MG PO TABS
5.0000 mg | ORAL_TABLET | Freq: Every day | ORAL | Status: DC
  Administered 2024-02-11 – 2024-02-13 (×3): 5 mg via ORAL
  Filled 2024-02-11 (×3): qty 1

## 2024-02-11 MED ORDER — PANTOPRAZOLE SODIUM 40 MG IV SOLR
40.0000 mg | Freq: Two times a day (BID) | INTRAVENOUS | Status: DC
  Administered 2024-02-11 – 2024-02-12 (×4): 40 mg via INTRAVENOUS
  Filled 2024-02-11 (×5): qty 10

## 2024-02-11 MED ORDER — FOLIC ACID 1 MG PO TABS
1.0000 mg | ORAL_TABLET | Freq: Every day | ORAL | Status: DC
  Administered 2024-02-11 – 2024-02-13 (×2): 1 mg via ORAL
  Filled 2024-02-11 (×2): qty 1

## 2024-02-11 MED ORDER — TAMSULOSIN HCL 0.4 MG PO CAPS
0.4000 mg | ORAL_CAPSULE | Freq: Every day | ORAL | Status: DC
  Administered 2024-02-11 – 2024-02-13 (×2): 0.4 mg via ORAL
  Filled 2024-02-11 (×2): qty 1

## 2024-02-11 MED ORDER — LACTATED RINGERS IV SOLN: INTRAVENOUS | Status: AC

## 2024-02-11 MED ORDER — AMLODIPINE BESYLATE-VALSARTAN 5-160 MG PO TABS: 1.0000 | ORAL_TABLET | Freq: Every day | ORAL | Status: DC

## 2024-02-11 MED ORDER — CYCLOBENZAPRINE HCL 5 MG PO TABS: 10.0000 mg | ORAL_TABLET | Freq: Three times a day (TID) | ORAL | Status: DC | PRN

## 2024-02-11 MED ORDER — IRBESARTAN 300 MG PO TABS
150.0000 mg | ORAL_TABLET | Freq: Every day | ORAL | Status: DC
  Administered 2024-02-11 – 2024-02-13 (×3): 150 mg via ORAL
  Filled 2024-02-11 (×3): qty 1

## 2024-02-11 MED ORDER — IOHEXOL 350 MG/ML SOLN
100.0000 mL | Freq: Once | INTRAVENOUS | Status: AC | PRN
  Administered 2024-02-11: 100 mL via INTRAVENOUS

## 2024-02-11 MED ORDER — OXYCODONE-ACETAMINOPHEN 5-325 MG PO TABS: 1.0000 | ORAL_TABLET | ORAL | Status: DC | PRN

## 2024-02-11 MED ORDER — HYDROXYCHLOROQUINE SULFATE 200 MG PO TABS
200.0000 mg | ORAL_TABLET | Freq: Every day | ORAL | Status: DC
  Administered 2024-02-11 – 2024-02-13 (×2): 200 mg via ORAL
  Filled 2024-02-11 (×3): qty 1

## 2024-02-11 NOTE — H&P (View-Only) (Signed)
 Consultation  Referring Provider: [facility] Primary Care Physician:  Loris Elsie PARAS, PA-C Primary Gastroenterologist:  Dr.Shearin/ [facility]  Reason for Consultation: Acute GI bleed with grossly bloody bowel movements  HPI: Lonnie Bowen is a 68 y.o. male with history of rheumatoid arthritis, hypertension, adult onset diabetes mellitus, and coronary artery disease status post PCI in April 2024.  He has been on chronic Plavix and aspirin. Patient had presented to the emergency room late last evening after onset of a large grossly bloody bowel movement at home x 1.  He waited to see if he would have any further bleeding, had a second episode and then came on to the emergency room.  He describes having at least 2 more episodes of large-volume bloody stools in the emergency room.  He did not have any bowel movements then during the next few hours until about 8 AM this morning when he had another episode of about the same volume. No syncope, no chest pain or shortness of breath, has had some lightheadedness.  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  Also noted multiple kidney stones.  There was no evidence of extravasation to suggest active bleeding.  Labs on admit 5 AM hemoglobin 9.9/hematocrit 29.6/MCV 96.4/platelets 316 No repeat hemoglobin done Pro time 14.3/INR 1.1 Potassium 4.3/BUN 22/creatinine 1.08 LFTs within normal limits  Review of care everywhere shows his hemoglobin was 15.3 in March 2025.  Patient reports prior history of colon polyps and last colonoscopy appears to have been done in 2022 by Dr. Delaine Mary.  I cannot see the results of that procedure but he says he believes he did not have any further polyps. He did have an episode of GI bleeding in 2020 for which he was hospitalized at [facility].  He says he was taking a lot of NSAIDs at that time and the bleed was deemed to  be associated with NSAIDs, he is not sure whether he had EGD and colonoscopy at that time or not.  More recently he has been taking repeated courses of steroids for rheumatoid arthritis, had not been on a PPI.  Apparently also had 1 episode of smaller volume bleeding a couple of weeks ago which then did not recur.  Also been taking turmeric and ginger at home for anti-inflammatory effect  Last dose of Plavix was yesterday.  He has not been great about taking his baby aspirin on a daily basis.  No current complaints of abdominal pain or cramping, no nausea or vomiting, no heartburn or indigestion, no dysphagia .  Past Medical History:  Diagnosis Date   Diabetes mellitus without complication (HCC)    Enlarged prostate    High cholesterol    Kidney stone     History reviewed. No pertinent surgical history.  Prior to Admission medications   Medication Sig Start Date End Date Taking? Authorizing Provider  amLODipine-valsartan (EXFORGE) 5-160 MG tablet Take 1 tablet by mouth daily. 11/24/23  Yes [provider]  clopidogrel (PLAVIX) 75 MG tablet Take 75 mg by mouth daily. 01/27/24  Yes [provider]  COD LIVER OIL PO Take 1 capsule by mouth daily.   Yes [provider]  folic acid (FOLVITE) 1 MG tablet Take 1 mg by mouth daily. 12/10/23  Yes [provider]  Ginger 500 MG CAPS Take 1 capsule by mouth daily at 12 noon.   Yes [provider]  hydroxychloroquine (  PLAQUENIL) 200 MG tablet Take 400 mg by mouth daily.   Yes [provider]  melatonin 5 MG TABS Take 5 mg by mouth at bedtime.   Yes [provider]  metFORMIN (GLUCOPHAGE) 500 MG tablet Take 500 mg by mouth every evening.   Yes [provider]  methotrexate (RHEUMATREX) 2.5 MG tablet Take 12.5 mg by mouth once a week. Wednesday 12/10/23  Yes [provider]  Multiple Vitamin (MULTIVITAMIN WITH MINERALS) TABS Take 1 tablet by mouth daily.   Yes [provider]  predniSONE (DELTASONE) 5 MG tablet Take 10 mg by mouth daily. 12/27/23  Yes [provider]  tamsulosin (FLOMAX) 0.4 MG CAPS capsule Take 0.4 mg by mouth daily after supper. 01/13/24  Yes [provider]  Turmeric (QC TUMERIC COMPLEX PO) Take 1 tablet by mouth daily.   Yes [provider]  Vitamin D-Vitamin K (VITAMIN K2-VITAMIN D3 PO) Take 1 capsule by mouth daily at 12 noon.   Yes [provider]    Current Facility-Administered Medications  Medication Dose Route Frequency Provider Last Rate Last Admin   amLODipine (NORVASC) tablet 5 mg  5 mg Oral Daily Tanda Powell ORN, RPH       And   irbesartan (AVAPRO) tablet 150 mg  150 mg Oral Daily Tanda Powell ORN, RPH       cyclobenzaprine  (FLEXERIL ) tablet 10 mg  10 mg Oral TID PRN Moore, Willie, MD       folic acid (FOLVITE) tablet 1 mg  1 mg Oral Daily Georgina Basket, MD   1 mg at 02/11/24 1230   hydroxychloroquine (PLAQUENIL) tablet 200 mg  200 mg Oral Daily Georgina Basket, MD       insulin aspart (novoLOG) injection 0-15 Units  0-15 Units Subcutaneous TID WC Georgina Basket, MD       lactated ringers infusion   Intravenous Continuous Georgina Basket, MD 100 mL/hr at 02/11/24 1235 New Bag at 02/11/24 1235   multivitamin with minerals tablet 1 tablet  1 tablet Oral Daily Georgina Basket, MD   1 tablet at 02/11/24 1230   oxyCODONE -acetaminophen  (PERCOCET/ROXICET) 5-325 MG per tablet 1 tablet  1 tablet Oral Q4H PRN Georgina Basket, MD       pantoprazole (PROTONIX) injection 40 mg  40 mg Intravenous Q12H Moore, Willie, MD   40 mg at 02/11/24 1230   tamsulosin (FLOMAX) capsule 0.4 mg  0.4 mg Oral Daily Georgina Basket, MD        Allergies as of 02/11/2024 - Review Complete 02/11/2024  Allergen Reaction Noted   Cipro [ciprofloxacin hcl] Nausea Only 02/11/2024    History reviewed. No pertinent family history.  Social History   Socioeconomic History   Marital status: Single    Spouse name: Not on file    Number of children: Not on file   Years of education: Not on file   Highest education level: Not on file  Occupational History   Not on file  Tobacco Use   Smoking status: Former   Smokeless tobacco: Never  Substance and Sexual Activity   Alcohol use: Yes    Comment: rare   Drug use: Yes    Types: Marijuana   Sexual activity: Not on file  Other Topics Concern   Not on file  Social History Narrative   Not on file   Social Drivers of Health   Financial Resource Strain: Low Risk  (04/25/2021)   Received from [facility] visits prior to  10/12/2022.   Overall Financial Resource Strain (CARDIA)    Difficulty of Paying Living Expenses: Not very hard  Food Insecurity: No Food Insecurity (02/11/2024)   Hunger Vital Sign    Worried About Running Out of Food in the Last Year: Never true    Ran Out of Food in the Last Year: Never true  Transportation Needs: No Transportation Needs (02/11/2024)   PRAPARE - Administrator, Civil Service (Medical): No    Lack of Transportation (Non-Medical): No  Physical Activity: Insufficiently Active (04/25/2021)   Received from [facility] visits prior to 10/12/2022.   Exercise Vital Sign    On average, how many days per week do you engage in moderate to strenuous exercise (like a brisk walk)?: 3 days    On average, how many minutes do you engage in exercise at this level?: 30 min  Stress: No Stress Concern Present (04/25/2021)   Received from [facility] visits prior to 10/12/2022.   [employer] of Occupational Health - Occupational Stress Questionnaire    Feeling of Stress : Only a little  Social Connections: Unknown (02/11/2024)   Social Connection and Isolation Panel    Frequency of Communication with Friends and Family: More than three times a week    Frequency of Social Gatherings with Friends and Family: More than three times a week    Attends Religious Services: 1 to 4 times  per year    Active Member of [employer] or Organizations: No    Attends Banker Meetings: Never    Marital Status: Patient declined  Catering manager Violence: Not At Risk (02/11/2024)   Humiliation, Afraid, Rape, and Kick questionnaire    Fear of Current or Ex-Partner: No    Emotionally Abused: No    Physically Abused: No    Sexually Abused: No    Review of Systems: Pertinent positive and negative review of systems were noted in the above HPI section.  All other review of systems was otherwise negative.   Physical Exam: Vital signs in last 24 hours: Temp:  [97.9 F (36.6 C)-98.1 F (36.7 C)] 98 F (36.7 C) (07/02 0812) Pulse Rate:  [95-109] 102 (07/02 0812) Resp:  [15-25] 15 (07/02 0730) BP: (108-132)/(66-79) 108/66 (07/02 0812) SpO2:  [97 %-100 %] 100 % (07/02 0812) Weight:  [82.6 kg] 82.6 kg (07/02 0348)   General:   Alert,  Well-developed, well-nourished,older WM pleasant and cooperative in NAD Head:  Normocephalic and atraumatic. Eyes:  Sclera clear, no icterus.   Conjunctiva pink. Ears:  Normal auditory acuity. Nose:  No deformity, discharge,  or lesions. Mouth:  No deformity or lesions.   Neck:  Supple; no masses or thyromegaly. Lungs:  Clear throughout to auscultation.   No wheezes, crackles, or rhonchi.  Heart:  Regular rate and rhythm; no murmurs, clicks, rubs,  or gallops. Abdomen:  Soft,nontender, BS active,nonpalp mass or hsm.   Rectal: not done- bloody BM's in ER Msk:  Symmetrical without gross deformities. . Pulses:  Normal pulses noted. Extremities:  Without clubbing or edema. Neurologic:  Alert and  oriented x4;  grossly normal neurologically. Skin:  Intact without significant lesions or rashes.. Psych:  Alert and cooperative. Normal mood and affect.  Intake/Output from previous day: No intake/output data recorded. Intake/Output this shift: No intake/output data recorded.  Lab Results: Recent Labs    02/11/24 0458  WBC 10.4  HGB 9.9*  HCT  29.6*  PLT 316   BMET  Recent Labs    02/11/24 0458  NA 140  K 4.3  CL 106  CO2 25  GLUCOSE 161*  BUN 22  CREATININE 1.08  CALCIUM 9.5   LFT Recent Labs    02/11/24 0458  PROT 5.5*  ALBUMIN 3.1*  AST 21  ALT 15  ALKPHOS 40  BILITOT 0.6   PT/INR Recent Labs    02/11/24 0458  LABPROT 14.3  INR 1.1   Hepatitis Panel No results for input(s): HEPBSAG, HCVAB, HEPAIGM, HEPBIGM in the last 72 hours.    IMPRESSION:  #46 68 year old white male who presents with acute GI bleed with grossly bloody bowel movements occurring early in the morning hours today.  This occurred in the setting of chronic Plavix and aspirin.  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  Hemoglobin 9.9 down from baseline of 15 Has been hemodynamically stable, has not required transfusion  Last episode of bloody stool was 8 AM  BUN is normal  Most likely this is a diverticular hemorrhage Patient has been on several recent courses of steroids,, raising risk for peptic ulcer disease.  #2 coronary artery disease status post PCI April 2024 for which she is on Plavix and aspirin 3 hypertension 4.  Adult onset diabetes mellitus 5.  Hyperlipidemia 6.  Previous history of GI bleed 2020 hospitalized at [facility], sounds like this may have been diverticular.  Last colonoscopy done in [facility] 2022 (unable to view that report) patient says he was not told that he had any polyps.  Plan; hemoglobin now then every 6 hours, transfuse for hemoglobin 7.5 or less given coronary artery disease and symptomatic state Continue to hold Plavix Will discuss with Dr. Leigh regarding considering EGD to rule out peptic ulcer disease and have asked the patient to stay n.p.o. until decision made. Will follow with you      Elwood Bazinet EsterwoodPA-C   02/11/2024, 12:58 PM

## 2024-02-11 NOTE — ED Provider Notes (Addendum)
  Provider Note MRN:  [id number]  Arrival date & time: 02/11/24    ED Course and Medical Decision Making  Assumed care from Dr Jerral at shift change.  See note from prior team for complete details, in brief:  Clinical Course as of 02/11/24 0717  Wed Feb 11, 2024  0704 Handoff CC 67 yo/m  Here w/ rectal bleeding CTA neg +brbpr continues Hgb 16 > 9 PRBC ordered TRH pending   [SG]    Clinical Course User Index [SG] Elnor Savant A, DO   [facility] notified  Spoke w/ TR[facility]    Procedures  Final Clinical Impressions(s) / ED Diagnoses     ICD-10-CM   1. Gastrointestinal hemorrhage, unspecified gastrointestinal hemorrhage type  K92.2       ED Discharge Orders     None       Discharge Instructions   None        Elnor Savant LABOR, DO 02/11/24 0717    Elnor Savant LABOR, DO 02/11/24 [phone]

## 2024-02-11 NOTE — ED Triage Notes (Signed)
 Pt arrived from home by EMS with c/o blood in stool. Reports being bright red. Two episodes tonight. Reports hx of similar several years ago after taking NSAIDs for knee pain. States couple weeks ago he was taking Voltaren for knee pain recently and noticed scant amount of blood in stool. Stopped medication and did not see blood in stool again until tonight. Denies abd pain or CP. Reports mild lightheadedness, has been seen by cardiology for it. Denies vision changes or SOA.

## 2024-02-11 NOTE — H&P (Signed)
 History and Physical    Patient: Lonnie Bowen FMW:[id number] DOB: 1956-02-16 DOA: 02/11/2024 DOS: the patient was seen and examined on 02/11/2024 PCP: Loris Elsie PARAS, PA-C  Patient coming from: Home  Chief Complaint:  Chief Complaint  Patient presents with   Rectal Bleeding    Blood in stool   HPI: Lonnie Bowen is a 68 y.o. male with medical history significant of HTN, HLD, and DM2 p/w BRPBR c/f GIB.  Pt states that he was in his USOH until last night around 0001 when he had a large bloody bowel movement that filled up the toilet bowl. He hoped this was a one off occurrence, and as such went back to bed before waking up within 45 minutes to have another bloody BM for which he called EMS. Pt denies any lighteheaded/dizziness, but does endorse having bloody bowel movements about 2 weeks ago that stopped on their own. Of note, pt has been prescribed multiple steroid tapers for his RA over the past 3 months without PPI ppx.  In the ED, pt tachycardic. Labs notable for Hb 9.9 (down from 16 years ago). CTA abd neg for active GIB, but did show large L renal stones. Pt continued to have large bloody BMs in the ED, and was admitted to medicine with GI following for ongoing evaluation.  Review of Systems: As mentioned in the history of present illness. All other systems reviewed and are negative. Past Medical History:  Diagnosis Date   Diabetes mellitus without complication (HCC)    Enlarged prostate    High cholesterol    Kidney stone    History reviewed. No pertinent surgical history. Social History:  reports that he has quit smoking. He has never used smokeless tobacco. He reports current alcohol use. He reports current drug use. Drug: Marijuana.  Allergies  Allergen Reactions   Cipro [Ciprofloxacin Hcl] Nausea Only    History reviewed. No pertinent family history.  Prior to Admission medications   Medication Sig Start Date End Date Taking? Authorizing Provider   amLODipine-valsartan (EXFORGE) 5-160 MG tablet Take 1 tablet by mouth daily. 11/24/23   [provider]  CHROMIUM PO Take 1 tablet by mouth daily.    [provider]  clopidogrel (PLAVIX) 75 MG tablet Take 75 mg by mouth daily. 01/27/24   [provider]  cyclobenzaprine  (FLEXERIL ) 10 MG tablet Take 1 tablet (10 mg total) by mouth 3 (three) times daily as needed for muscle spasms. 08/30/12   Odell Balls, PA-C  folic acid (FOLVITE) 1 MG tablet Take 1 mg by mouth daily. 12/10/23   [provider]  GINSENG PO Take 1 capsule by mouth daily.    [provider]  hydroxychloroquine (PLAQUENIL) 200 MG tablet Take 200 mg by mouth daily.    [provider]  metFORMIN (GLUCOPHAGE) 500 MG tablet Take 250 mg by mouth every evening.    [provider]  methotrexate (RHEUMATREX) 2.5 MG tablet Take 12.5 mg by mouth once a week. 12/10/23   [provider]  MILK THISTLE PO Take 1 capsule by mouth daily.    [provider]  Multiple Vitamin (MULTIVITAMIN WITH MINERALS) TABS Take 1 tablet by mouth daily.    [provider]  Omega-3 Fatty Acids (FISH OIL PO) Take 1 capsule by mouth daily.    [provider]  OXYCODONE  HCL PO Take 1 tablet by mouth once.    [provider]  oxyCODONE -acetaminophen  (PERCOCET/ROXICET) 5-325 MG per tablet Take 1 tablet by  mouth every 4 (four) hours as needed for pain. 08/30/12   Odell Balls, PA-C  predniSONE (DELTASONE) 5 MG tablet Take 5 mg by mouth daily. 12/27/23   [provider]  tamsulosin (FLOMAX) 0.4 MG CAPS capsule Take 0.4 mg by mouth daily. 01/13/24   [provider]    Physical Exam: Vitals:   02/11/24 0600 02/11/24 0630 02/11/24 0730 02/11/24 0812  BP: 120/73 129/71 132/77 108/66  Pulse: 96 95 (!) 106 (!) 102  Resp:  (!) 25 15   Temp:   97.9 F (36.6 C) 98 F (36.7 C)  TempSrc:   Oral   SpO2: 98% 100% 100% 100%  Weight:      Height:        General: Alert, oriented x3, resting comfortably in no acute distress Respiratory: Lungs clear to auscultation bilaterally with normal respiratory effort; no w/r/r Cardiovascular: Regular rate and rhythm w/o m/r/g Abdomen: Soft, nontender, nondistended. Positive bowel sounds   Data Reviewed:  Lab Results  Component Value Date   WBC 10.4 02/11/2024   HGB 9.9 (L) 02/11/2024   HCT 29.6 (L) 02/11/2024   MCV 96.4 02/11/2024   PLT 316 02/11/2024   Lab Results  Component Value Date   GLUCOSE 161 (H) 02/11/2024   CALCIUM 9.5 02/11/2024   NA 140 02/11/2024   K 4.3 02/11/2024   CO2 25 02/11/2024   CL 106 02/11/2024   BUN 22 02/11/2024   CREATININE 1.08 02/11/2024   Lab Results  Component Value Date   ALT 15 02/11/2024   AST 21 02/11/2024   ALKPHOS 40 02/11/2024   BILITOT 0.6 02/11/2024   Lab Results  Component Value Date   INR 1.1 02/11/2024    Radiology: CT ANGIO GI BLEED Result Date: 02/11/2024 CLINICAL DATA:  Lower GI bleeding.  Right red blood in stool. EXAM: CTA ABDOMEN AND PELVIS WITHOUT AND WITH CONTRAST TECHNIQUE: Multidetector CT imaging of the abdomen and pelvis was performed using the standard protocol during bolus administration of intravenous contrast. Multiplanar reconstructed images and MIPs were obtained and reviewed to evaluate the vascular anatomy. RADIATION DOSE REDUCTION: This exam was performed according to the departmental dose-optimization program which includes automated exposure control, adjustment of the mA and/or kV according to patient size and/or use of iterative reconstruction technique. CONTRAST:  100mL OMNIPAQUE IOHEXOL 350 MG/ML SOLN COMPARISON:  None Available. FINDINGS: VASCULAR Aorta: Mild to moderate atherosclerosis. Normal caliber aorta without aneurysm, dissection, vasculitis or significant stenosis. Celiac: Patent without evidence of aneurysm, dissection, vasculitis or significant stenosis. SMA: Patent without evidence of aneurysm, dissection,  vasculitis or significant stenosis. Replaced right hepatic artery. Renals: Focal 25-50% narrowing right renal artery. Left renal artery widely patent. IMA: Patent without evidence of aneurysm, dissection, vasculitis or significant stenosis. Inflow: Patent without evidence of aneurysm, dissection, vasculitis or significant stenosis. Proximal Outflow: Bilateral common femoral and visualized portions of the superficial and profunda femoral arteries are patent without evidence of aneurysm, dissection, vasculitis or significant stenosis. Veins: No obvious venous abnormality within the limitations of this arterial phase study. Review of the MIP images confirms the above findings. NON-VASCULAR Lower chest: No acute abnormality. Hepatobiliary: No suspicious focal abnormality within the liver parenchyma. There is no evidence for gallstones, gallbladder wall thickening, or pericholecystic fluid. No intrahepatic or extrahepatic biliary dilation. Pancreas: No focal mass lesion. No dilatation of the main duct. No intraparenchymal cyst. No peripancreatic edema. Spleen: Calcified granulomata Adrenals/Urinary Tract: No adrenal nodule or mass. Right kidney and ureter unremarkable. Dilated left upper pole  renal calyx with multiple left-sided renal stones measuring up to 14 x 10 mm in the interpolar region and 16 x 10 mm in the lower pole. The 14 x 10 mm interpolar stone appears to be the site of obstruction for the distended upper pole calyx. Left ureter unremarkable. Bladder is decompressed. Stomach/Bowel: Stomach is unremarkable. No gastric wall thickening. No evidence of outlet obstruction. No contrast extravasation into the gastric lumen to suggest active GI bleeding. Duodenum is normally positioned as is the ligament of Treitz. Duodenal diverticulum noted. No contrast accumulation in lumen of the duodenum or small bowel to suggest active hemorrhage. The terminal ileum is normal. The appendix is normal. Diverticuli are seen  scattered along the entire length of the colon without CT findings of diverticulitis. No contrast extravasation into the lumen of the colon to suggest active hemorrhage. Subcentimeter foci of fat density in the mid transverse colon may reflect small lipomas (image 33 and 37 of series 4). Lymphatic: There is no gastrohepatic or hepatoduodenal ligament lymphadenopathy. No retroperitoneal or mesenteric lymphadenopathy. No pelvic sidewall lymphadenopathy. Reproductive: The prostate gland and seminal vesicles are unremarkable. Other: No intraperitoneal free fluid. Musculoskeletal: No worrisome lytic or sclerotic osseous abnormality. IMPRESSION: 1. No contrast extravasation into the lumen of the stomach, small bowel, or colon to suggest active GI bleeding. 2. 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 The 14 x 10 mm interpolar stone appears to be the site of obstruction for the distended upper pole calyx. Urology consultation recommended 3.  Aortic Atherosclerosis (ICD10-I70.0). Electronically Signed   By: Camellia Candle M.D.   On: 02/11/2024 06:40    Assessment and Plan: 6M h/o HTN, DM2, and RA p/w BRPBR c/f GIB.  BRBPR Presumed LGIB -GI consulted; apprec eval/recs -2 large PIVs; f/u CBC q8h, goal Hb >7, transfuse prn; IV PPI BID; MIVF w/ LR 100cc/h x 24h; HOLD OAC and antiplatelet agents   L nephrolithiasis -OP urology on discharge for consideration of ESWL (lithotripsy)  H/o RA -PTA plaquenil 200mg  daily -PTA oxycodone  5mg  q4h prn -HOLD pta prednisone and methotrexate; pt advised to avoid short courses of steroids in the future, especially without PPI ppx  HTN -PTA amlodipine and valsartan  DM2 -HOLD pta metformin for now -SSI TID AC prn   Advance Care Planning:   Code Status: Full Code   Consults: GI  Family Communication: N/A  Severity of Illness: The appropriate  patient status for this patient is INPATIENT. Inpatient status is judged to be reasonable and necessary in order to provide the required intensity of service to ensure the patient's safety. The patient's presenting symptoms, physical exam findings, and initial radiographic and laboratory data in the context of their chronic comorbidities is felt to place them at high risk for further clinical deterioration. Furthermore, it is not anticipated that the patient will be medically stable for discharge from the hospital within 2 midnights of admission.   * I certify that at the point of admission it is my clinical judgment that the patient will require inpatient hospital care spanning beyond 2 midnights from the point of admission due to high intensity of service, high risk for further deterioration and high frequency of surveillance required.*   ------- I spent 55 minutes reviewing previous labs/notes, obtaining separate history at the bedside, counseling/discussing the treatment plan outlined above, ordering medications/tests, and performing clinical documentation.  Author: Marsha Ada, MD 02/11/2024 11:27 AM  For on call review [email or web address].

## 2024-02-11 NOTE — Plan of Care (Signed)
  Problem: Education: Goal: Knowledge of General Education information will improve Description: Including pain rating scale, medication(s)/side effects and non-pharmacologic comfort measures Outcome: Progressing   Problem: Activity: Goal: Risk for activity intolerance will decrease Outcome: Progressing   Problem: Nutrition: Goal: Adequate nutrition will be maintained Outcome: Progressing   Problem: Elimination: Goal: Will not experience complications related to bowel motility Outcome: Progressing Goal: Will not experience complications related to urinary retention Outcome: Progressing   Problem: Pain Managment: Goal: General experience of comfort will improve and/or be controlled Outcome: Progressing   Problem: Safety: Goal: Ability to remain free from injury will improve Outcome: Progressing

## 2024-02-11 NOTE — TOC CM/SW Note (Signed)
 Transition of Care [facility]) - Inpatient Brief Assessment   Patient Details  Name: Lonnie Bowen MRN: [id number] Date of Birth: October 02, 1955  Transition of Care [facility]) CM/SW Contact:    Lauraine FORBES Saa, LCSW Phone Number: 02/11/2024, 10:14 AM   Clinical Narrative:  10:14 AM Per chart review, patient resides at home alone. Patient has a PCP and insurance. Patient does not have SNF/HH/DME history. Patient's preferred pharmacy is [facility] [phone] [facility]. No TOC needs were identified at this time. TOC will continue to follow and be available to assist.  Transition of Care Asessment: Insurance and Status: Insurance coverage has been reviewed Patient has primary care physician: Yes Home environment has been reviewed: Private Residence Prior level of function:: N/A Prior/Current Home Services: No current home services Social Drivers of Health Review: SDOH reviewed no interventions necessary Readmission risk has been reviewed: Yes Transition of care needs: no transition of care needs at this time

## 2024-02-11 NOTE — Consult Note (Addendum)
 Consultation  Referring Provider: [facility] Primary Care Physician:  Loris Elsie PARAS, PA-C Primary Gastroenterologist:  Dr.Shearin/ [facility]  Reason for Consultation: Acute GI bleed with grossly bloody bowel movements  HPI: Lonnie Bowen is a 68 y.o. male with history of rheumatoid arthritis, hypertension, adult onset diabetes mellitus, and coronary artery disease status post PCI in April 2024.  He has been on chronic Plavix and aspirin. Patient had presented to the emergency room late last evening after onset of a large grossly bloody bowel movement at home x 1.  He waited to see if he would have any further bleeding, had a second episode and then came on to the emergency room.  He describes having at least 2 more episodes of large-volume bloody stools in the emergency room.  He did not have any bowel movements then during the next few hours until about 8 AM this morning when he had another episode of about the same volume. No syncope, no chest pain or shortness of breath, has had some lightheadedness.  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  Also noted multiple kidney stones.  There was no evidence of extravasation to suggest active bleeding.  Labs on admit 5 AM hemoglobin 9.9/hematocrit 29.6/MCV 96.4/platelets 316 No repeat hemoglobin done Pro time 14.3/INR 1.1 Potassium 4.3/BUN 22/creatinine 1.08 LFTs within normal limits  Review of care everywhere shows his hemoglobin was 15.3 in March 2025.  Patient reports prior history of colon polyps and last colonoscopy appears to have been done in 2022 by Dr. Delaine Mary.  I cannot see the results of that procedure but he says he believes he did not have any further polyps. He did have an episode of GI bleeding in 2020 for which he was hospitalized at [facility].  He says he was taking a lot of NSAIDs at that time and the bleed was deemed to  be associated with NSAIDs, he is not sure whether he had EGD and colonoscopy at that time or not.  More recently he has been taking repeated courses of steroids for rheumatoid arthritis, had not been on a PPI.  Apparently also had 1 episode of smaller volume bleeding a couple of weeks ago which then did not recur.  Also been taking turmeric and ginger at home for anti-inflammatory effect  Last dose of Plavix was yesterday.  He has not been great about taking his baby aspirin on a daily basis.  No current complaints of abdominal pain or cramping, no nausea or vomiting, no heartburn or indigestion, no dysphagia .  Past Medical History:  Diagnosis Date   Diabetes mellitus without complication (HCC)    Enlarged prostate    High cholesterol    Kidney stone     History reviewed. No pertinent surgical history.  Prior to Admission medications   Medication Sig Start Date End Date Taking? Authorizing Provider  amLODipine-valsartan (EXFORGE) 5-160 MG tablet Take 1 tablet by mouth daily. 11/24/23  Yes [provider]  clopidogrel (PLAVIX) 75 MG tablet Take 75 mg by mouth daily. 01/27/24  Yes [provider]  COD LIVER OIL PO Take 1 capsule by mouth daily.   Yes [provider]  folic acid (FOLVITE) 1 MG tablet Take 1 mg by mouth daily. 12/10/23  Yes [provider]  Ginger 500 MG CAPS Take 1 capsule by mouth daily at 12 noon.   Yes [provider]  hydroxychloroquine (  PLAQUENIL) 200 MG tablet Take 400 mg by mouth daily.   Yes [provider]  melatonin 5 MG TABS Take 5 mg by mouth at bedtime.   Yes [provider]  metFORMIN (GLUCOPHAGE) 500 MG tablet Take 500 mg by mouth every evening.   Yes [provider]  methotrexate (RHEUMATREX) 2.5 MG tablet Take 12.5 mg by mouth once a week. Wednesday 12/10/23  Yes [provider]  Multiple Vitamin (MULTIVITAMIN WITH MINERALS) TABS Take 1 tablet by mouth daily.   Yes [provider]  predniSONE (DELTASONE) 5 MG tablet Take 10 mg by mouth daily. 12/27/23  Yes [provider]  tamsulosin (FLOMAX) 0.4 MG CAPS capsule Take 0.4 mg by mouth daily after supper. 01/13/24  Yes [provider]  Turmeric (QC TUMERIC COMPLEX PO) Take 1 tablet by mouth daily.   Yes [provider]  Vitamin D-Vitamin K (VITAMIN K2-VITAMIN D3 PO) Take 1 capsule by mouth daily at 12 noon.   Yes [provider]    Current Facility-Administered Medications  Medication Dose Route Frequency Provider Last Rate Last Admin   amLODipine (NORVASC) tablet 5 mg  5 mg Oral Daily Tanda Powell ORN, RPH       And   irbesartan (AVAPRO) tablet 150 mg  150 mg Oral Daily Tanda Powell ORN, RPH       cyclobenzaprine  (FLEXERIL ) tablet 10 mg  10 mg Oral TID PRN Moore, Willie, MD       folic acid (FOLVITE) tablet 1 mg  1 mg Oral Daily Georgina Basket, MD   1 mg at 02/11/24 1230   hydroxychloroquine (PLAQUENIL) tablet 200 mg  200 mg Oral Daily Georgina Basket, MD       insulin aspart (novoLOG) injection 0-15 Units  0-15 Units Subcutaneous TID WC Georgina Basket, MD       lactated ringers infusion   Intravenous Continuous Georgina Basket, MD 100 mL/hr at 02/11/24 1235 New Bag at 02/11/24 1235   multivitamin with minerals tablet 1 tablet  1 tablet Oral Daily Georgina Basket, MD   1 tablet at 02/11/24 1230   oxyCODONE -acetaminophen  (PERCOCET/ROXICET) 5-325 MG per tablet 1 tablet  1 tablet Oral Q4H PRN Georgina Basket, MD       pantoprazole (PROTONIX) injection 40 mg  40 mg Intravenous Q12H Moore, Willie, MD   40 mg at 02/11/24 1230   tamsulosin (FLOMAX) capsule 0.4 mg  0.4 mg Oral Daily Georgina Basket, MD        Allergies as of 02/11/2024 - Review Complete 02/11/2024  Allergen Reaction Noted   Cipro [ciprofloxacin hcl] Nausea Only 02/11/2024    History reviewed. No pertinent family history.  Social History   Socioeconomic History   Marital status: Single    Spouse name: Not on file    Number of children: Not on file   Years of education: Not on file   Highest education level: Not on file  Occupational History   Not on file  Tobacco Use   Smoking status: Former   Smokeless tobacco: Never  Substance and Sexual Activity   Alcohol use: Yes    Comment: rare   Drug use: Yes    Types: Marijuana   Sexual activity: Not on file  Other Topics Concern   Not on file  Social History Narrative   Not on file   Social Drivers of Health   Financial Resource Strain: Low Risk  (04/25/2021)   Received from [facility] visits prior to  10/12/2022.   Overall Financial Resource Strain (CARDIA)    Difficulty of Paying Living Expenses: Not very hard  Food Insecurity: No Food Insecurity (02/11/2024)   Hunger Vital Sign    Worried About Running Out of Food in the Last Year: Never true    Ran Out of Food in the Last Year: Never true  Transportation Needs: No Transportation Needs (02/11/2024)   PRAPARE - Administrator, Civil Service (Medical): No    Lack of Transportation (Non-Medical): No  Physical Activity: Insufficiently Active (04/25/2021)   Received from [facility] visits prior to 10/12/2022.   Exercise Vital Sign    On average, how many days per week do you engage in moderate to strenuous exercise (like a brisk walk)?: 3 days    On average, how many minutes do you engage in exercise at this level?: 30 min  Stress: No Stress Concern Present (04/25/2021)   Received from [facility] visits prior to 10/12/2022.   [employer] of Occupational Health - Occupational Stress Questionnaire    Feeling of Stress : Only a little  Social Connections: Unknown (02/11/2024)   Social Connection and Isolation Panel    Frequency of Communication with Friends and Family: More than three times a week    Frequency of Social Gatherings with Friends and Family: More than three times a week    Attends Religious Services: 1 to 4 times  per year    Active Member of [employer] or Organizations: No    Attends Banker Meetings: Never    Marital Status: Patient declined  Catering manager Violence: Not At Risk (02/11/2024)   Humiliation, Afraid, Rape, and Kick questionnaire    Fear of Current or Ex-Partner: No    Emotionally Abused: No    Physically Abused: No    Sexually Abused: No    Review of Systems: Pertinent positive and negative review of systems were noted in the above HPI section.  All other review of systems was otherwise negative.   Physical Exam: Vital signs in last 24 hours: Temp:  [97.9 F (36.6 C)-98.1 F (36.7 C)] 98 F (36.7 C) (07/02 0812) Pulse Rate:  [95-109] 102 (07/02 0812) Resp:  [15-25] 15 (07/02 0730) BP: (108-132)/(66-79) 108/66 (07/02 0812) SpO2:  [97 %-100 %] 100 % (07/02 0812) Weight:  [82.6 kg] 82.6 kg (07/02 0348)   General:   Alert,  Well-developed, well-nourished,older WM pleasant and cooperative in NAD Head:  Normocephalic and atraumatic. Eyes:  Sclera clear, no icterus.   Conjunctiva pink. Ears:  Normal auditory acuity. Nose:  No deformity, discharge,  or lesions. Mouth:  No deformity or lesions.   Neck:  Supple; no masses or thyromegaly. Lungs:  Clear throughout to auscultation.   No wheezes, crackles, or rhonchi.  Heart:  Regular rate and rhythm; no murmurs, clicks, rubs,  or gallops. Abdomen:  Soft,nontender, BS active,nonpalp mass or hsm.   Rectal: not done- bloody BM's in ER Msk:  Symmetrical without gross deformities. . Pulses:  Normal pulses noted. Extremities:  Without clubbing or edema. Neurologic:  Alert and  oriented x4;  grossly normal neurologically. Skin:  Intact without significant lesions or rashes.. Psych:  Alert and cooperative. Normal mood and affect.  Intake/Output from previous day: No intake/output data recorded. Intake/Output this shift: No intake/output data recorded.  Lab Results: Recent Labs    02/11/24 0458  WBC 10.4  HGB 9.9*  HCT  29.6*  PLT 316   BMET  Recent Labs    02/11/24 0458  NA 140  K 4.3  CL 106  CO2 25  GLUCOSE 161*  BUN 22  CREATININE 1.08  CALCIUM 9.5   LFT Recent Labs    02/11/24 0458  PROT 5.5*  ALBUMIN 3.1*  AST 21  ALT 15  ALKPHOS 40  BILITOT 0.6   PT/INR Recent Labs    02/11/24 0458  LABPROT 14.3  INR 1.1   Hepatitis Panel No results for input(s): HEPBSAG, HCVAB, HEPAIGM, HEPBIGM in the last 72 hours.    IMPRESSION:  #46 68 year old white male who presents with acute GI bleed with grossly bloody bowel movements occurring early in the morning hours today.  This occurred in the setting of chronic Plavix and aspirin.  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  Hemoglobin 9.9 down from baseline of 15 Has been hemodynamically stable, has not required transfusion  Last episode of bloody stool was 8 AM  BUN is normal  Most likely this is a diverticular hemorrhage Patient has been on several recent courses of steroids,, raising risk for peptic ulcer disease.  #2 coronary artery disease status post PCI April 2024 for which she is on Plavix and aspirin 3 hypertension 4.  Adult onset diabetes mellitus 5.  Hyperlipidemia 6.  Previous history of GI bleed 2020 hospitalized at [facility], sounds like this may have been diverticular.  Last colonoscopy done in [facility] 2022 (unable to view that report) patient says he was not told that he had any polyps.  Plan; hemoglobin now then every 6 hours, transfuse for hemoglobin 7.5 or less given coronary artery disease and symptomatic state Continue to hold Plavix Will discuss with Dr. Leigh regarding considering EGD to rule out peptic ulcer disease and have asked the patient to stay n.p.o. until decision made. Will follow with you      Elwood Bazinet EsterwoodPA-C   02/11/2024, 12:58 PM

## 2024-02-11 NOTE — ED Provider Notes (Signed)
 [facility] [facility] Provider Note   CSN: [id number] Arrival date & time: 02/11/24  9661     History Chief Complaint  Patient presents with   Rectal Bleeding    Blood in stool    HPI Lonnie Bowen is a 68 y.o. male presenting for BRBPR States that he had 2 episodes tonight. History of similar 2/2 NSAID use Sees [facility] for preventative care. Had CAD 1 year ago and still on DAPT. Patient's recorded medical, surgical, social, medication list and allergies were reviewed in the Snapshot window as part of the initial history.   Review of Systems   Review of Systems  Constitutional:  Negative for chills and fever.  HENT:  Negative for ear pain and sore throat.   Eyes:  Negative for pain and visual disturbance.  Respiratory:  Negative for cough and shortness of breath.   Cardiovascular:  Negative for chest pain and palpitations.  Gastrointestinal:  Negative for abdominal pain and vomiting.  Genitourinary:  Negative for dysuria and hematuria.  Musculoskeletal:  Negative for arthralgias and back pain.  Skin:  Negative for color change and rash.  Neurological:  Positive for light-headedness. Negative for seizures and syncope.  All other systems reviewed and are negative.   Physical Exam Updated Vital Signs BP (!) 140/71 (BP Location: Left Arm)   Pulse 91   Temp 97.8 F (36.6 C) (Oral)   Resp 18   Ht 6' (1.829 m)   Wt 82.6 kg   SpO2 98%   BMI 24.68 kg/m  Physical Exam Vitals and nursing note reviewed.  Constitutional:      General: He is not in acute distress.    Appearance: He is well-developed.  HENT:     Head: Normocephalic and atraumatic.  Eyes:     Conjunctiva/sclera: Conjunctivae normal.  Cardiovascular:     Rate and Rhythm: Normal rate and regular rhythm.     Heart sounds: No murmur heard. Pulmonary:     Effort: Pulmonary effort is normal. No respiratory distress.     Breath sounds: Normal breath sounds.  Abdominal:      Palpations: Abdomen is soft.     Tenderness: There is no abdominal tenderness.  Musculoskeletal:        General: No swelling.     Cervical back: Neck supple.  Skin:    General: Skin is warm and dry.     Capillary Refill: Capillary refill takes less than 2 seconds.  Neurological:     Mental Status: He is alert.  Psychiatric:        Mood and Affect: Mood normal.      ED Course/ Medical Decision Making/ A&P Clinical Course as of 02/18/24 2305  Wed Feb 11, 2024  0704 Handoff CC 67 yo/m  Here w/ rectal bleeding CTA neg +brbpr continues Hgb 16 > 9 PRBC ordered TRH pending   [SG]    Clinical Course User Index [SG] Elnor Savant A, DO    Procedures Procedures   Medications Ordered in ED Medications  lactated ringers  infusion ( Intravenous Stopped 02/12/24 0822)  lactated ringers  bolus 1,000 mL (0 mLs Intravenous Stopped 02/11/24 0716)  iohexol  (OMNIPAQUE ) 350 MG/ML injection 100 mL (100 mLs Intravenous Contrast Given 02/11/24 9378)    Medical Decision Making:   AIKAM VINJE is a 68 y.o. male who presented to the ED today with BRBPR detailed above.    Patient placed on continuous vitals and telemetry monitoring while in ED which  was reviewed periodically.  Complete initial physical exam performed, notably the patient  was HDS in NAD.    Reviewed and confirmed nursing documentation for past medical history, family history, social history.    Initial Assessment:   With the patient's presentation of BRBPR, most likely diagnosis is LGIB. Other diagnoses were considered including (but not limited to) infection. These are considered less likely due to history of present illness and physical exam findings.   This is most consistent with an acute life/limb threatening illness complicated by underlying chronic conditions.  Initial Plan:  CTA AP due to amount and degree of bleeding  Screening labs including CBC and Metabolic panel to evaluate for infectious or metabolic etiology  of disease.  Urinalysis with reflex culture ordered to evaluate for UTI or relevant urologic/nephrologic pathology.  CXR to evaluate for structural/infectious intrathoracic pathology.  EKG to evaluate for cardiac pathology Objective evaluation as below reviewed   Initial Study Results:   Laboratory  All laboratory results reviewed without evidence of clinically relevant pathology.     EKG EKG was reviewed independently. Rate, rhythm, axis, intervals all examined and without medically relevant abnormality. ST segments without concerns for elevations.    Radiology:  All images reviewed independently. Agree with radiology report at this time.   DG Chest 2 View Result Date: 02/13/2024 CLINICAL DATA:  Shortness of breath EXAM: CHEST - 2 VIEW COMPARISON:  03/16/2018 FINDINGS: Heart and mediastinal contours are within normal limits. No focal opacities or effusions. No acute bony abnormality. Aortic atherosclerosis. IMPRESSION: No active cardiopulmonary disease. Electronically Signed   By: Franky Crease M.D.   On: 02/13/2024 17:37   CT ANGIO GI BLEED Result Date: 02/11/2024 CLINICAL DATA:  Lower GI bleeding.  Right red blood in stool. EXAM: CTA ABDOMEN AND PELVIS WITHOUT AND WITH CONTRAST TECHNIQUE: Multidetector CT imaging of the abdomen and pelvis was performed using the standard protocol during bolus administration of intravenous contrast. Multiplanar reconstructed images and MIPs were obtained and reviewed to evaluate the vascular anatomy. RADIATION DOSE REDUCTION: This exam was performed according to the departmental dose-optimization program which includes automated exposure control, adjustment of the mA and/or kV according to patient size and/or use of iterative reconstruction technique. CONTRAST:  OMNIPAQUE  IOHEXOL  350 MG/ML SOLN COMPARISON:  None Available. FINDINGS: VASCULAR Aorta: Mild to moderate atherosclerosis. Normal caliber aorta without aneurysm, dissection, vasculitis or significant  stenosis. Celiac: Patent without evidence of aneurysm, dissection, vasculitis or significant stenosis. SMA: Patent without evidence of aneurysm, dissection, vasculitis or significant stenosis. Replaced right hepatic artery. Renals: Focal 25-50% narrowing right renal artery. Left renal artery widely patent. IMA: Patent without evidence of aneurysm, dissection, vasculitis or significant stenosis. Inflow: Patent without evidence of aneurysm, dissection, vasculitis or significant stenosis. Proximal Outflow: Bilateral common femoral and visualized portions of the superficial and profunda femoral arteries are patent without evidence of aneurysm, dissection, vasculitis or significant stenosis. Veins: No obvious venous abnormality within the limitations of this arterial phase study. Review of the MIP images confirms the above findings. NON-VASCULAR Lower chest: No acute abnormality. Hepatobiliary: No suspicious focal abnormality within the liver parenchyma. There is no evidence for gallstones, gallbladder wall thickening, or pericholecystic fluid. No intrahepatic or extrahepatic biliary dilation. Pancreas: No focal mass lesion. No dilatation of the main duct. No intraparenchymal cyst. No peripancreatic edema. Spleen: Calcified granulomata Adrenals/Urinary Tract: No adrenal nodule or mass. Right kidney and ureter unremarkable. Dilated left upper pole renal calyx with multiple left-sided renal stones measuring up to 14 x 10  mm in the interpolar region and 16 x 10 mm in the lower pole. The 14 x 10 mm interpolar stone appears to be the site of obstruction for the distended upper pole calyx. Left ureter unremarkable. Bladder is decompressed. Stomach/Bowel: Stomach is unremarkable. No gastric wall thickening. No evidence of outlet obstruction. No contrast extravasation into the gastric lumen to suggest active GI bleeding. Duodenum is normally positioned as is the ligament of Treitz. Duodenal diverticulum noted. No contrast  accumulation in lumen of the duodenum or small bowel to suggest active hemorrhage. The terminal ileum is normal. The appendix is normal. Diverticuli are seen scattered along the entire length of the colon without CT findings of diverticulitis. No contrast extravasation into the lumen of the colon to suggest active hemorrhage. Subcentimeter foci of fat density in the mid transverse colon may reflect small lipomas (image 33 and 37 of series 4). Lymphatic: There is no gastrohepatic or hepatoduodenal ligament lymphadenopathy. No retroperitoneal or mesenteric lymphadenopathy. No pelvic sidewall lymphadenopathy. Reproductive: The prostate gland and seminal vesicles are unremarkable. Other: No intraperitoneal free fluid. Musculoskeletal: No worrisome lytic or sclerotic osseous abnormality. IMPRESSION: 1. No contrast extravasation into the lumen of the stomach, small bowel, or colon to suggest active GI bleeding. 2. 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 The 14 x 10 mm interpolar stone appears to be the site of obstruction for the distended upper pole calyx. Urology consultation recommended 3.  Aortic Atherosclerosis (ICD10-I70.0). Electronically Signed   By: Camellia Candle M.D.   On: 02/11/2024 06:40    Care of patient signed out to oncoming team for admission. GI notified. Disposition:   Based on the above findings, I believe this patient is stable for admission.    Patient/family educated about specific findings on our evaluation and explained exact reasons for admission.  Patient/family educated about clinical situation and time was allowed to answer questions.   Admission team communicated with and agreed with need for admission. Patient admitted. Patient  ready to move at this time.     Emergency Department Medication Summary:   Medications  lactated ringers  infusion ( Intravenous  Stopped 02/12/24 0822)  lactated ringers  bolus 1,000 mL (0 mLs Intravenous Stopped 02/11/24 0716)  iohexol  (OMNIPAQUE ) 350 MG/ML injection 100 mL (100 mLs Intravenous Contrast Given 02/11/24 0621)          Clinical Impression:  1. Gastrointestinal hemorrhage, unspecified gastrointestinal hemorrhage type      Admit   Final Clinical Impression(s) / ED Diagnoses Final diagnoses:  Gastrointestinal hemorrhage, unspecified gastrointestinal hemorrhage type    Rx / DC Orders      Jerral Meth, MD 02/18/24 2305

## 2024-02-12 ENCOUNTER — Inpatient Hospital Stay (HOSPITAL_COMMUNITY)

## 2024-02-12 ENCOUNTER — Encounter (HOSPITAL_COMMUNITY): Payer: Self-pay | Admitting: Hospitalist

## 2024-02-12 ENCOUNTER — Encounter (HOSPITAL_COMMUNITY)
Admission: EM | Disposition: A | Discharge status: Home / Self Care | Payer: Self-pay | Source: Home / Self Care | Attending: Hospitalist

## 2024-02-12 DIAGNOSIS — K2980 Duodenitis without bleeding: Secondary | ICD-10-CM

## 2024-02-12 DIAGNOSIS — E119 Type 2 diabetes mellitus without complications: Secondary | ICD-10-CM | POA: Diagnosis not present

## 2024-02-12 DIAGNOSIS — Z87891 Personal history of nicotine dependence: Secondary | ICD-10-CM

## 2024-02-12 DIAGNOSIS — K2270 Barrett's esophagus without dysplasia: Secondary | ICD-10-CM

## 2024-02-12 DIAGNOSIS — K449 Diaphragmatic hernia without obstruction or gangrene: Secondary | ICD-10-CM

## 2024-02-12 DIAGNOSIS — D62 Acute posthemorrhagic anemia: Secondary | ICD-10-CM | POA: Diagnosis not present

## 2024-02-12 DIAGNOSIS — I2510 Atherosclerotic heart disease of native coronary artery without angina pectoris: Secondary | ICD-10-CM

## 2024-02-12 DIAGNOSIS — Z7902 Long term (current) use of antithrombotics/antiplatelets: Secondary | ICD-10-CM

## 2024-02-12 DIAGNOSIS — K3189 Other diseases of stomach and duodenum: Secondary | ICD-10-CM | POA: Diagnosis not present

## 2024-02-12 HISTORY — PX: ESOPHAGOGASTRODUODENOSCOPY: SHX5428

## 2024-02-12 LAB — HEMOGLOBIN AND HEMATOCRIT, BLOOD
HCT: 21.5 % — ABNORMAL LOW (ref 39.0–52.0)
HCT: 21.9 % — ABNORMAL LOW (ref 39.0–52.0)
HCT: 23.6 % — ABNORMAL LOW (ref 39.0–52.0)
Hemoglobin: 7.3 g/dL — ABNORMAL LOW (ref 13.0–17.0)
Hemoglobin: 7.3 g/dL — ABNORMAL LOW (ref 13.0–17.0)
Hemoglobin: 7.9 g/dL — ABNORMAL LOW (ref 13.0–17.0)

## 2024-02-12 LAB — PREPARE RBC (CROSSMATCH)

## 2024-02-12 LAB — GLUCOSE, CAPILLARY
Glucose-Capillary: 123 mg/dL — ABNORMAL HIGH (ref 70–99)
Glucose-Capillary: 164 mg/dL — ABNORMAL HIGH (ref 70–99)
Glucose-Capillary: 151 mg/dL — ABNORMAL HIGH (ref 70–99)

## 2024-02-12 SURGERY — EGD (ESOPHAGOGASTRODUODENOSCOPY)
Anesthesia: Monitor Anesthesia Care

## 2024-02-12 MED ORDER — PROPOFOL 500 MG/50ML IV EMUL
INTRAVENOUS | Status: DC | PRN
  Administered 2024-02-12: 50 mg via INTRAVENOUS

## 2024-02-12 MED ORDER — SODIUM CHLORIDE 0.9% IV SOLUTION
Freq: Once | INTRAVENOUS | Status: DC
Start: 2024-02-12 — End: 2024-02-13

## 2024-02-12 MED ORDER — PHENYLEPHRINE 80 MCG/ML (10ML) SYRINGE FOR IV PUSH (FOR BLOOD PRESSURE SUPPORT)
PREFILLED_SYRINGE | INTRAVENOUS | Status: DC | PRN
  Administered 2024-02-12 (×3): 80 ug via INTRAVENOUS
  Administered 2024-02-12: 160 ug via INTRAVENOUS
  Administered 2024-02-12 (×2): 80 ug via INTRAVENOUS
  Administered 2024-02-12 (×2): 160 ug via INTRAVENOUS
  Administered 2024-02-12: 80 ug via INTRAVENOUS

## 2024-02-12 MED ORDER — STERILE WATER FOR IRRIGATION IR SOLN
Status: DC | PRN
  Administered 2024-02-12: 30 mL
  Administered 2024-02-12: 50 mL

## 2024-02-12 MED ORDER — LIDOCAINE 2% (20 MG/ML) 5 ML SYRINGE
INTRAMUSCULAR | Status: DC | PRN
  Administered 2024-02-12: 100 mg via INTRAVENOUS

## 2024-02-12 MED ORDER — SODIUM CHLORIDE 0.9 % IV SOLN: INTRAVENOUS | Status: DC | PRN

## 2024-02-12 MED ORDER — EPHEDRINE SULFATE-NACL 50-0.9 MG/10ML-% IV SOSY
PREFILLED_SYRINGE | INTRAVENOUS | Status: DC | PRN
  Administered 2024-02-12 (×2): 7.5 mg via INTRAVENOUS

## 2024-02-12 MED ORDER — PROPOFOL 10 MG/ML IV BOLUS
INTRAVENOUS | Status: DC | PRN
  Administered 2024-02-12: 125 ug/kg/min via INTRAVENOUS

## 2024-02-12 NOTE — Transfer of Care (Signed)
 Immediate Anesthesia Transfer of Care Note  Patient: Lonnie Bowen  Procedure(s) Performed: EGD (ESOPHAGOGASTRODUODENOSCOPY)  Patient Location: Endoscopy Unit  Anesthesia Type:MAC  Level of Consciousness: awake, alert , and oriented  Airway & Oxygen Therapy: Patient Spontanous Breathing and Patient connected to face mask oxygen  Post-op Assessment: Report given to RN and Post -op Vital signs reviewed and stable  Post vital signs: Reviewed and stable  Last Vitals:  Vitals Value Taken Time  BP 97/50 02/12/24 12:07  Temp    Pulse 113 02/12/24 12:09  Resp 16 02/12/24 12:09  SpO2 100 % 02/12/24 12:09  Vitals shown include unfiled device data.  Last Pain:  Vitals:   02/12/24 0953  TempSrc: Temporal  PainSc: 2       Patients Stated Pain Goal: 0 (02/12/24 0205)  Complications: No notable events documented.

## 2024-02-12 NOTE — Plan of Care (Signed)
  Problem: Education: Goal: Knowledge of General Education information will improve Description: Including pain rating scale, medication(s)/side effects and non-pharmacologic comfort measures Outcome: Progressing   Problem: Activity: Goal: Risk for activity intolerance will decrease Outcome: Progressing   Problem: Nutrition: Goal: Adequate nutrition will be maintained Outcome: Progressing   Problem: Elimination: Goal: Will not experience complications related to bowel motility Outcome: Progressing Goal: Will not experience complications related to urinary retention Outcome: Progressing   Problem: Pain Managment: Goal: General experience of comfort will improve and/or be controlled Outcome: Progressing   Problem: Safety: Goal: Ability to remain free from injury will improve Outcome: Progressing

## 2024-02-12 NOTE — Interval H&P Note (Signed)
 History and Physical Interval Note: EGD to evaluate, rule out upper GI bleed. No rebleeding overnight. No interval changes. Hgb remains in 7s. EGD to further evaluate, discussed risks / benefits, he wishes to proceed  02/12/2024 11:46 AM  Lonnie Bowen  has presented today for surgery, with the diagnosis of GI bleeding.  The various methods of treatment have been discussed with the patient and family. After consideration of risks, benefits and other options for treatment, the patient has consented to  Procedure(s): EGD (ESOPHAGOGASTRODUODENOSCOPY) (N/A) as a surgical intervention.  The patient's history has been reviewed, patient examined, no change in status, stable for surgery.  I have reviewed the patient's chart and labs.  Questions were answered to the patient's satisfaction.     Elspeth P Judaea Burgoon

## 2024-02-12 NOTE — Anesthesia Preprocedure Evaluation (Addendum)
 Anesthesia Evaluation  Patient identified by MRN, date of birth, ID band Patient awake    Reviewed: Allergy & Precautions, H&P , NPO status , Patient's Chart, lab work & pertinent test results  Airway Mallampati: II  TM Distance: >3 FB Neck ROM: Full    Dental no notable dental hx. (+) Teeth Intact, Dental Advisory Given   Pulmonary former smoker   Pulmonary exam normal breath sounds clear to auscultation       Cardiovascular + CAD and + Cardiac Stents   Rhythm:Regular Rate:Normal     Neuro/Psych negative neurological ROS  negative psych ROS   GI/Hepatic negative GI ROS, Neg liver ROS,,,  Endo/Other  diabetes, Type 2, Oral Hypoglycemic Agents    Renal/GU negative Renal ROS  negative genitourinary   Musculoskeletal   Abdominal   Peds  Hematology negative hematology ROS (+)   Anesthesia Other Findings   Reproductive/Obstetrics negative OB ROS                              Anesthesia Physical Anesthesia Plan  ASA: 3  Anesthesia Plan: MAC   Post-op Pain Management: Minimal or no pain anticipated   Induction: Intravenous  PONV Risk Score and Plan: 1 and Propofol infusion  Airway Management Planned: Natural Airway and Simple Face Mask  Additional Equipment:   Intra-op Plan:   Post-operative Plan:   Informed Consent: I have reviewed the patients History and Physical, chart, labs and discussed the procedure including the risks, benefits and alternatives for the proposed anesthesia with the patient or authorized representative who has indicated his/her understanding and acceptance.     Dental advisory given  Plan Discussed with: CRNA  Anesthesia Plan Comments:          Anesthesia Quick Evaluation

## 2024-02-12 NOTE — Op Note (Signed)
 [facility] Patient Name: Lonnie Bowen Procedure Date : 02/12/2024 MRN: [id number] Attending MD: Elspeth SQUIBB. Leigh , MD, [id number] Date of Birth: 1955-10-06 CSN: [id number] Age: 68 Admit Type: Inpatient Procedure:                Upper GI endoscopy Indications:              Acute post hemorrhagic anemia - bright red blood                            per rectum on Plavix / aspirin, and steroid use.                            Suspected diverticular bleed, but ruling out upper                            source, at risk for PUD Providers:                Elspeth P. Leigh, MD, Ozell Pouch,                            Coye Bade, Technician Referring MD:              Medicines:                Monitored Anesthesia Care Complications:            No immediate complications. Estimated blood loss:                            Minimal. Estimated Blood Loss:     Estimated blood loss was minimal. Procedure:                Pre-Anesthesia Assessment:                           - Prior to the procedure, a History and Physical                            was performed, and patient medications and                            allergies were reviewed. The patient's tolerance of                            previous anesthesia was also reviewed. The risks                            and benefits of the procedure and the sedation                            options and risks were discussed with the patient.                            All questions were answered, and informed consent  was obtained. Prior Anticoagulants: The patient has                            taken Plavix (clopidogrel), last dose was 2 days                            prior to procedure. ASA Grade Assessment: III - A                            patient with severe systemic disease. After                            reviewing the risks and benefits, the patient was                             deemed in satisfactory condition to undergo the                            procedure.                           After obtaining informed consent, the endoscope was                            passed under direct vision. Throughout the                            procedure, the patient's blood pressure, pulse, and                            oxygen saturations were monitored continuously. The                            GIF-H190 ([id number]) Olympus endoscope was introduced                            through the mouth, and advanced to the second part                            of duodenum. The upper GI endoscopy was                            accomplished without difficulty. The patient                            tolerated the procedure well. Scope In: Scope Out: Findings:      Esophagogastric landmarks were identified: the Z-line was found at 38       cm, the gastroesophageal junction was found at 39 cm and the upper       extent of the gastric folds was found at 40 cm from the incisors.      A 1 cm hiatal hernia was present.      The Z-line was irregular with 2 tongues of salmon colored mucosa roughly  1cm in length. Biopsies were taken with a cold forceps for histology.      The exam of the esophagus was otherwise normal.      The entire examined stomach was normal. Biopsies were taken with a cold       forceps for Helicobacter pylori testing given duodenitis noted on the       exam.      Diffuse erythematous mucosa was found in the duodenal bulb.      The exam of the duodenum was otherwise normal. No evidence of PUD Impression:               - Esophagogastric landmarks identified.                           - 1 cm hiatal hernia.                           - Z-line irregular. Biopsied.                           - Normal esophagus otherwise.                           - Normal stomach. Biopsied.                           - Erythematous duodenopathy.                           No evidence  of pathology to cause bleeding. He is                            likely having a diverticular bleed, seems to have                            slowed and improved, Hgb remains in the 7s. Recommendation:           - Return patient to hospital ward for ongoing care.                           - Full liquid diet, advance to soft later today if                            he does well                           - Continue present medications.                           - Await pathology results.                           - Would give 1 unit PRBC to this cardiac patient                            for Hgb in the 7s                           -  Trend Hgb and monitor for recurrent bleeding                           - If he does well today without rebleeding,                            discharge home tomorrow.                           - Continue to hold Plavix at this time Procedure Code(s):        --- Professional ---                           [phone], Esophagogastroduodenoscopy, flexible,                            transoral; with biopsy, single or multiple Diagnosis Code(s):        --- Professional ---                           K44.9, Diaphragmatic hernia without obstruction or                            gangrene                           K22.89, Other specified disease of esophagus                           K31.89, Other diseases of stomach and duodenum                           D62, Acute posthemorrhagic anemia CPT copyright 2022 American Medical Association. All rights reserved. The codes documented in this report are preliminary and upon coder review may  be revised to meet current compliance requirements. Elspeth P. Ezzie Senat, MD 02/12/2024 12:17:37 PM This report has been signed electronically. Number of Addenda: 0

## 2024-02-12 NOTE — Anesthesia Postprocedure Evaluation (Signed)
 Anesthesia Post Note  Patient: Lonnie Bowen  Procedure(s) Performed: EGD (ESOPHAGOGASTRODUODENOSCOPY)     Patient location during evaluation: Endoscopy Anesthesia Type: MAC Level of consciousness: awake and alert Pain management: pain level controlled Vital Signs Assessment: post-procedure vital signs reviewed and stable Respiratory status: spontaneous breathing, nonlabored ventilation and respiratory function stable Cardiovascular status: stable and blood pressure returned to baseline Postop Assessment: no apparent nausea or vomiting Anesthetic complications: no   No notable events documented.  Last Vitals:  Vitals:   02/12/24 1230 02/12/24 1240  BP: 114/72 113/73  Pulse: 100 100  Resp: 15 17  Temp:    SpO2: 97% 98%    Last Pain:  Vitals:   02/12/24 1240  TempSrc:   PainSc: 0-No pain                 Abelino Tippin,W. EDMOND

## 2024-02-12 NOTE — Progress Notes (Signed)
 Progress Note    [name]  FMW:[id number] DOB: February 03, 1956  DOA: 02/11/2024 PCP: Loris Elsie PARAS, PA-C      Brief Narrative:    Medical records reviewed and are as summarized below:  Lonnie Bowen is a 68 y.o. male with medical history significant for hypertension, hyperlipidemia, type II DM, rheumatoid arthritis (has used Imuran in the past and was recently on prednisone), CAD s/p PCI in April 2024 on aspirin and Plavix.  He presented to the hospital because of rectal bleeding (passage of bright red blood stools).  He had about 2 episodes of rectal bleeding.  He felt a little lightheaded afterwards.  He said about 2 weeks prior to admission, he had an episode of rectal bleeding but it resolved spontaneously.  In the ED, he was tachycardic but normotensive.  Hemoglobin on admission was 9.9 and it dropped to 7.1.       Assessment/Plan:   Principal Problem:   GIB (gastrointestinal bleeding) Active Problems:   Acute post-hemorrhagic anemia   Antiplatelet or antithrombotic long-term use   Duodenitis   Barrett's esophagus without dysplasia    Body mass index is 24.68 kg/m.   Acute GI bleeding, presumed lower GI bleeding: S/p EGD on 02/12/2024 which showed 1 cm hiatal hernia, normal esophagus, normal stomach, erythematous duodenopathy.  No cause of bleeding was found.  Diverticular bleed is suspected. He has been started on full liquids. Case was discussed with Dr. Arlene, gastroenterologist.  Possible discharge home tomorrow.   Acute blood loss anemia from acute GI bleeding: Hemoglobin was 9.9 on admission.  Dropped to 7.1 at its lowest.  Transfuse 1 unit of PRBCs given underlying CAD.  Discussed risks and benefits of blood transfusion patient is agreeable.  Monitor H&H   CAD s/p PCI in April 2024: Aspirin and Plavix have been held.   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     Rheumatoid arthritis: Continue Plaquenil.  He said he had recently been on prednisone taper.  He had also taken Humira in the past but he can no longer afford this.  He has been advised to reestablish care with a rheumatologist.   Comorbidities include hypertension, type II DM   Diet Order             Diet full liquid Room service appropriate? Yes; Fluid consistency: Thin  Diet effective now                            Consultants: Gastroenterologist  Procedures: EGD    Medications:    sodium chloride   Intravenous Once   amLODipine  5 mg Oral Daily   And   irbesartan  150 mg Oral Daily   folic acid  1 mg Oral Daily   hydroxychloroquine  200 mg Oral Daily   insulin aspart  0-15 Units Subcutaneous TID WC   multivitamin with minerals  1 tablet Oral Daily   pantoprazole (PROTONIX) IV  40 mg Intravenous Q12H   tamsulosin  0.4 mg Oral Daily   Continuous Infusions:   Anti-infectives (From admission, onward)    Start     Dose/Rate Route Frequency Ordered Stop   02/11/24 1230  hydroxychloroquine (PLAQUENIL) tablet 200 mg        200 mg Oral Daily 02/11/24 1135                Family  Communication/Anticipated D/C date and plan/Code Status   DVT prophylaxis: SCDs Start: 02/11/24 0718     Code Status: Full Code  Family Communication: None Disposition Plan: Plan to discharge home tomorrow   Status is: Inpatient Remains inpatient appropriate because: Acute GI bleeding with blood loss anemia       Subjective:   Interval events noted.  He felt a little lightheaded earlier.  No rectal bleeding today.  No vomiting or abdominal pain.  He feels better.  Objective:    Vitals:   02/12/24 1210 02/12/24 1220 02/12/24 1230 02/12/24 1240  BP: 104/65 105/66 114/72 113/73  Pulse: (!) 113 (!) 105 100 100  Resp: 16 11 15 17   Temp:      TempSrc:      SpO2: 100% 99% 97% 98%  Weight:      Height:       No data found.   Intake/Output  Summary (Last 24 hours) at 02/12/2024 1434 Last data filed at 02/12/2024 1206 Gross per 24 hour  Intake 2226.15 ml  Output --  Net 2226.15 ml   Filed Weights   02/11/24 0348  Weight: 82.6 kg    Exam:  GEN: NAD SKIN: No rash EYES: Pale but anicteric ENT: MMM CV: RRR PULM: CTA B ABD: soft, ND, NT, +BS CNS: AAO x 3, non focal EXT: No edema or tenderness        Data Reviewed:   I have personally reviewed following labs and imaging studies:  Labs: Labs show the following:   Basic Metabolic Panel: Recent Labs  Lab 02/11/24 0458  NA 140  K 4.3  CL 106  CO2 25  GLUCOSE 161*  BUN 22  CREATININE 1.08  CALCIUM 9.5   GFR Estimated Creatinine Clearance: 72.8 mL/min (by C-G formula based on SCr of 1.08 mg/dL). Liver Function Tests: Recent Labs  Lab 02/11/24 0458  AST 21  ALT 15  ALKPHOS 40  BILITOT 0.6  PROT 5.5*  ALBUMIN 3.1*   No results for input(s): LIPASE, AMYLASE in the last 168 hours. No results for input(s): AMMONIA in the last 168 hours. Coagulation profile Recent Labs  Lab 02/11/24 0458  INR 1.1    CBC: Recent Labs  Lab 02/11/24 0458 02/11/24 1401 02/11/24 1854 02/12/24 0026 02/12/24 0632 02/12/24 1338  WBC 10.4  --   --   --   --   --   NEUTROABS 6.7  --   --   --   --   --   HGB 9.9* 7.7* 7.1* 7.3* 7.3* 7.9*  HCT 29.6* 22.4* 20.9* 21.9* 21.5* 23.6*  MCV 96.4  --   --   --   --   --   PLT 316  --   --   --   --   --    Cardiac Enzymes: No results for input(s): CKTOTAL, CKMB, CKMBINDEX, TROPONINI in the last 168 hours. BNP (last 3 results) No results for input(s): PROBNP in the last 8760 hours. CBG: Recent Labs  Lab 02/11/24 1622 02/11/24 2050 02/12/24 0739  GLUCAP 113* 145* 123*   D-Dimer: No results for input(s): DDIMER in the last 72 hours. Hgb A1c: Recent Labs    02/11/24 1009  HGBA1C 6.0*   Lipid Profile: Recent Labs    02/11/24 1009  CHOL 92  HDL 25*  LDLCALC 39  TRIG 857  CHOLHDL 3.7    Thyroid function studies: Recent Labs    02/11/24 1009  TSH 0.782   Anemia work  up: No results for input(s): VITAMINB12, FOLATE, FERRITIN, TIBC, IRON, RETICCTPCT in the last 72 hours. Sepsis Labs: Recent Labs  Lab 02/11/24 0458  WBC 10.4    Microbiology No results found for this or any previous visit (from the past 240 hours).  Procedures and diagnostic studies:  CT ANGIO GI BLEED Result Date: 02/11/2024 CLINICAL DATA:  Lower GI bleeding.  Right red blood in stool. EXAM: CTA ABDOMEN AND PELVIS WITHOUT AND WITH CONTRAST TECHNIQUE: Multidetector CT imaging of the abdomen and pelvis was performed using the standard protocol during bolus administration of intravenous contrast. Multiplanar reconstructed images and MIPs were obtained and reviewed to evaluate the vascular anatomy. RADIATION DOSE REDUCTION: This exam was performed according to the departmental dose-optimization program which includes automated exposure control, adjustment of the mA and/or kV according to patient size and/or use of iterative reconstruction technique. CONTRAST:  100mL OMNIPAQUE IOHEXOL 350 MG/ML SOLN COMPARISON:  None Available. FINDINGS: VASCULAR Aorta: Mild to moderate atherosclerosis. Normal caliber aorta without aneurysm, dissection, vasculitis or significant stenosis. Celiac: Patent without evidence of aneurysm, dissection, vasculitis or significant stenosis. SMA: Patent without evidence of aneurysm, dissection, vasculitis or significant stenosis. Replaced right hepatic artery. Renals: Focal 25-50% narrowing right renal artery. Left renal artery widely patent. IMA: Patent without evidence of aneurysm, dissection, vasculitis or significant stenosis. Inflow: Patent without evidence of aneurysm, dissection, vasculitis or significant stenosis. Proximal Outflow: Bilateral common femoral and visualized portions of the superficial and profunda femoral arteries are patent without evidence of aneurysm,  dissection, vasculitis or significant stenosis. Veins: No obvious venous abnormality within the limitations of this arterial phase study. Review of the MIP images confirms the above findings. NON-VASCULAR Lower chest: No acute abnormality. Hepatobiliary: No suspicious focal abnormality within the liver parenchyma. There is no evidence for gallstones, gallbladder wall thickening, or pericholecystic fluid. No intrahepatic or extrahepatic biliary dilation. Pancreas: No focal mass lesion. No dilatation of the main duct. No intraparenchymal cyst. No peripancreatic edema. Spleen: Calcified granulomata Adrenals/Urinary Tract: No adrenal nodule or mass. Right kidney and ureter unremarkable. 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 The 14 x 10 mm interpolar stone appears to be the site of obstruction for the distended upper pole calyx. Left ureter unremarkable. Bladder is decompressed. Stomach/Bowel: Stomach is unremarkable. No gastric wall thickening. No evidence of outlet obstruction. No contrast extravasation into the gastric lumen to suggest active GI bleeding. Duodenum is normally positioned as is the ligament of Treitz. Duodenal diverticulum noted. No contrast accumulation in lumen of the duodenum or small bowel to suggest active hemorrhage. The terminal ileum is normal. The appendix is normal. Diverticuli are seen scattered along the entire length of the colon without CT findings of diverticulitis. No contrast extravasation into the lumen of the colon to suggest active hemorrhage. Subcentimeter foci of fat density in the mid transverse colon may reflect small lipomas (image 33 and 37 of series 4). Lymphatic: There is no gastrohepatic or hepatoduodenal ligament lymphadenopathy. No retroperitoneal or mesenteric lymphadenopathy. No pelvic sidewall lymphadenopathy. Reproductive: The prostate gland and  seminal vesicles are unremarkable. Other: No intraperitoneal free fluid. Musculoskeletal: No worrisome lytic or sclerotic osseous abnormality. IMPRESSION: 1. No contrast extravasation into the lumen of the stomach, small bowel, or colon to suggest active GI bleeding. 2. 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 The 14 x 10 mm interpolar stone appears to be the site of obstruction for the distended upper pole calyx. Urology consultation recommended 3.  Aortic Atherosclerosis (ICD10-I70.0). Electronically Signed   By: Camellia Candle M.D.   On: 02/11/2024 06:40               LOS: 1 day   Phinneas Shakoor  [facility]   Pager on [email or web address]. If 7PM-7AM, please contact night-coverage at [email or web address]     02/12/2024, 2:34 PM

## 2024-02-12 NOTE — TOC CM/SW Note (Signed)
 Transition of Care [facility]) - Inpatient Brief Assessment   Patient Details  Name: Lonnie Bowen MRN: [id number] Date of Birth: 05/07/1956  Transition of [facility] CM/SW Contact:    Lauraine FORBES Saa, LCSW Phone Number: 02/12/2024, 3:43 PM   Clinical Narrative:  3:43 PM CSW acknowledged and completed [facility] consult for Medicare Part B assistance as TOC does not assist with Medicare application.  Transition of Care Asessment: Insurance and Status: Insurance coverage has been reviewed Patient has primary care physician: Yes Home environment has been reviewed: Private Residence Prior level of function:: N/A Prior/Current Home Services: No current home services Social Drivers of Health Review: SDOH reviewed no interventions necessary Readmission risk has been reviewed: Yes Transition of care needs: no transition of care needs at this time

## 2024-02-13 ENCOUNTER — Encounter (HOSPITAL_COMMUNITY): Payer: Self-pay | Admitting: Emergency Medicine

## 2024-02-13 ENCOUNTER — Emergency Department (HOSPITAL_COMMUNITY)

## 2024-02-13 ENCOUNTER — Emergency Department (HOSPITAL_COMMUNITY)
Admission: EM | Admit: 2024-02-13 | Discharge: 2024-02-14 | Disposition: A | Discharge status: Home / Self Care | Attending: Emergency Medicine | Admitting: Emergency Medicine

## 2024-02-13 ENCOUNTER — Other Ambulatory Visit: Payer: Self-pay

## 2024-02-13 DIAGNOSIS — R0600 Dyspnea, unspecified: Secondary | ICD-10-CM | POA: Diagnosis present

## 2024-02-13 DIAGNOSIS — D649 Anemia, unspecified: Secondary | ICD-10-CM | POA: Diagnosis not present

## 2024-02-13 DIAGNOSIS — Z7902 Long term (current) use of antithrombotics/antiplatelets: Secondary | ICD-10-CM | POA: Insufficient documentation

## 2024-02-13 DIAGNOSIS — R0602 Shortness of breath: Secondary | ICD-10-CM | POA: Diagnosis not present

## 2024-02-13 DIAGNOSIS — I2510 Atherosclerotic heart disease of native coronary artery without angina pectoris: Secondary | ICD-10-CM | POA: Insufficient documentation

## 2024-02-13 LAB — TROPONIN I (HIGH SENSITIVITY): Troponin I (High Sensitivity): 5 ng/L (ref <18)

## 2024-02-13 LAB — TYPE AND SCREEN
ABO/RH(D): A POS
Antibody Screen: NEGATIVE
Unit division: 0

## 2024-02-13 LAB — CBC WITH DIFFERENTIAL/PLATELET
Abs Immature Granulocytes: 0.11 K/uL — ABNORMAL HIGH (ref 0.00–0.07)
Basophils Absolute: 0.1 K/uL (ref 0.0–0.1)
Basophils Relative: 1 %
Eosinophils Absolute: 0.3 K/uL (ref 0.0–0.5)
Eosinophils Relative: 3 %
HCT: 29 % — ABNORMAL LOW (ref 39.0–52.0)
Hemoglobin: 9.9 g/dL — ABNORMAL LOW (ref 13.0–17.0)
Immature Granulocytes: 1 %
Lymphocytes Relative: 15 %
Lymphs Abs: 1.4 K/uL (ref 0.7–4.0)
MCH: 31.8 pg (ref 26.0–34.0)
MCHC: 34.1 g/dL (ref 30.0–36.0)
MCV: 93.2 fL (ref 80.0–100.0)
Monocytes Absolute: 0.7 K/uL (ref 0.1–1.0)
Monocytes Relative: 7 %
Neutro Abs: 6.5 K/uL (ref 1.7–7.7)
Neutrophils Relative %: 73 %
Platelets: 318 K/uL (ref 150–400)
RBC: 3.11 MIL/uL — ABNORMAL LOW (ref 4.22–5.81)
RDW: 16.9 % — ABNORMAL HIGH (ref 11.5–15.5)
WBC: 9 K/uL (ref 4.0–10.5)
nRBC: 0 % (ref 0.0–0.2)

## 2024-02-13 LAB — BPAM RBC
Blood Product Expiration Date: 202508042359
ISSUE DATE / TIME: 202507031520
Unit Type and Rh: 6200

## 2024-02-13 LAB — BASIC METABOLIC PANEL WITH GFR
Anion gap: 8 (ref 5–15)
BUN: 8 mg/dL (ref 8–23)
CO2: 26 mmol/L (ref 22–32)
Calcium: 9.3 mg/dL (ref 8.9–10.3)
Chloride: 105 mmol/L (ref 98–111)
Creatinine, Ser: 1.6 mg/dL — ABNORMAL HIGH (ref 0.61–1.24)
GFR, Estimated: 47 mL/min — ABNORMAL LOW (ref >60)
Glucose, Bld: 177 mg/dL — ABNORMAL HIGH (ref 70–99)
Potassium: 4.1 mmol/L (ref 3.5–5.1)
Sodium: 139 mmol/L (ref 135–145)

## 2024-02-13 LAB — CBC
HCT: 26.7 % — ABNORMAL LOW (ref 39.0–52.0)
Hemoglobin: 9 g/dL — ABNORMAL LOW (ref 13.0–17.0)
MCH: 31.5 pg (ref 26.0–34.0)
MCHC: 33.7 g/dL (ref 30.0–36.0)
MCV: 93.4 fL (ref 80.0–100.0)
Platelets: 267 K/uL (ref 150–400)
RBC: 2.86 MIL/uL — ABNORMAL LOW (ref 4.22–5.81)
RDW: 16.9 % — ABNORMAL HIGH (ref 11.5–15.5)
WBC: 6.5 K/uL (ref 4.0–10.5)
nRBC: 0.3 % — ABNORMAL HIGH (ref 0.0–0.2)

## 2024-02-13 LAB — GLUCOSE, CAPILLARY: Glucose-Capillary: 147 mg/dL — ABNORMAL HIGH (ref 70–99)

## 2024-02-13 MED ORDER — SODIUM CHLORIDE 0.9 % IV BOLUS
1000.0000 mL | Freq: Once | INTRAVENOUS | Status: AC
  Administered 2024-02-13: 1000 mL via INTRAVENOUS

## 2024-02-13 MED ORDER — PANTOPRAZOLE SODIUM 40 MG PO TBEC: 40.0000 mg | DELAYED_RELEASE_TABLET | Freq: Every day | ORAL | 1 refills | Status: AC

## 2024-02-13 NOTE — ED Triage Notes (Signed)
 Pt via POV c/o SOB since early this afternoon, just discharged this morning after admission for GI bleed. HR 123 in triage. Denies CP.

## 2024-02-13 NOTE — Progress Notes (Signed)
 Patient verbalized understanding of instructions. All belonging and paperwork given to patient

## 2024-02-13 NOTE — ED Provider Notes (Signed)
 [place] EMERGENCY DEPARTMENT AT [facility] Provider Note   CSN: [id number] Arrival date & time: 02/13/24  1656     Patient presents with: Shortness of Breath   Lonnie Bowen is a 68 y.o. male.   68 year old male with prior medical history as detailed below presents for evaluation.  Patient was discharged earlier this morning after admission for GI bleed evaluation.  Patient required transfusion for symptomatic anemia -1 unit PRBC.  Patient source of bleeding was suspected to be diverticular.  EGD was without acute abnormality identified.  Patient is on aspirin and Plavix with history of PCI in April 2024.  Patient reports that he went home and noted a fast heart rate up into the 120s.  He also noted some mild dyspnea with exertion.  He tried to drink fluids through the course of the morning without improvement in his tachycardia or his dyspnea.  He therefore decided to come to the ED for reevaluation.    He denies chest pain.  He denies fever.  He denies recurrent bleeding.  The history is provided by the patient and medical records.       Prior to Admission medications   Medication Sig Start Date End Date Taking? Authorizing Provider  amLODipine -valsartan  (EXFORGE ) 5-160 MG tablet Take 1 tablet by mouth daily. 11/24/23   [provider]  COD LIVER OIL PO Take 1 capsule by mouth daily.    [provider]  folic acid  (FOLVITE ) 1 MG tablet Take 1 mg by mouth daily. 12/10/23   [provider]  Ginger 500 MG CAPS Take 1 capsule by mouth daily at 12 noon.    [provider]  hydroxychloroquine  (PLAQUENIL ) 200 MG tablet Take 400 mg by mouth daily.    [provider]  melatonin 5 MG TABS Take 5 mg by mouth at bedtime.    [provider]  metFORMIN (GLUCOPHAGE) 500 MG tablet Take 500 mg by mouth every evening.    [provider]  methotrexate (RHEUMATREX) 2.5 MG tablet Take 12.5 mg by mouth once a week. Wednesday  12/10/23   [provider]  Multiple Vitamin (MULTIVITAMIN WITH MINERALS) TABS Take 1 tablet by mouth daily.    [provider]  pantoprazole  (PROTONIX ) 40 MG tablet Take 1 tablet (40 mg total) by mouth daily. 02/13/24 02/12/25  Krishnan, Gokul, MD  predniSONE (DELTASONE) 5 MG tablet Take 10 mg by mouth daily. 12/27/23   [provider]  tamsulosin  (FLOMAX ) 0.4 MG CAPS capsule Take 0.4 mg by mouth daily after supper. 01/13/24   [provider]  Turmeric (QC TUMERIC COMPLEX PO) Take 1 tablet by mouth daily.    [provider]  Vitamin D-Vitamin K (VITAMIN K2-VITAMIN D3 PO) Take 1 capsule by mouth daily at 12 noon.    [provider]    Allergies: Cipro [ciprofloxacin hcl]    Review of Systems  All other systems reviewed and are negative.   Updated Vital Signs BP (!) 142/82   Pulse 95   Temp 97.7 F (36.5 C) (Oral)   Resp (!) 21   Ht 6' (1.829 m)   Wt 82.6 kg   SpO2 100%   BMI 24.68 kg/m   Physical Exam Vitals and nursing note reviewed.  Constitutional:      General: He is not in acute distress.    Appearance: Normal appearance. He is well-developed.  HENT:     Head: Normocephalic and atraumatic.  Eyes:     Conjunctiva/sclera:  Conjunctivae normal.     Pupils: Pupils are equal, round, and reactive to light.  Cardiovascular:     Rate and Rhythm: Regular rhythm. Tachycardia present.     Heart sounds: Normal heart sounds.  Pulmonary:     Effort: Pulmonary effort is normal. No respiratory distress.     Breath sounds: Normal breath sounds.  Abdominal:     General: There is no distension.     Palpations: Abdomen is soft.     Tenderness: There is no abdominal tenderness.  Musculoskeletal:        General: No deformity. Normal range of motion.     Cervical back: Normal range of motion and neck supple.  Skin:    General: Skin is warm and dry.  Neurological:     General: No focal deficit present.     Mental Status: He is alert and  oriented to person, place, and time.     (all labs ordered are listed, but only abnormal results are displayed) Labs Reviewed  CBC WITH DIFFERENTIAL/PLATELET - Abnormal; Notable for the following components:      Result Value   RBC 3.11 (*)    Hemoglobin 9.9 (*)    HCT 29.0 (*)    RDW 16.9 (*)    Abs Immature Granulocytes 0.11 (*)    All other components within normal limits  BASIC METABOLIC PANEL WITH GFR - Abnormal; Notable for the following components:   Glucose, Bld 177 (*)    Creatinine, Ser 1.60 (*)    GFR, Estimated 47 (*)    All other components within normal limits  TROPONIN I (HIGH SENSITIVITY)    EKG: None  Radiology: DG Chest 2 View Result Date: 02/13/2024 CLINICAL DATA:  Shortness of breath EXAM: CHEST - 2 VIEW COMPARISON:  03/16/2018 FINDINGS: Heart and mediastinal contours are within normal limits. No focal opacities or effusions. No acute bony abnormality. Aortic atherosclerosis. IMPRESSION: No active cardiopulmonary disease. Electronically Signed   By: Franky Crease M.D.   On: 02/13/2024 17:37     Procedures   Medications Ordered in the ED  sodium chloride  0.9 % bolus 1,000 mL (has no administration in time range)                                    Medical Decision Making Amount and/or Complexity of Data Reviewed Labs: ordered. Radiology: ordered.    Medical Screen Complete  This patient presented to the ED with complaint of dyspnea, rapid heart rate.  This complaint involves an extensive number of treatment options. The initial differential diagnosis includes, but is not limited to, recurrent GI bleed, anemia, ACS, PE, metabolic abnormality  This presentation is: Acute, Chronic, Self-Limited, Previously Undiagnosed, Uncertain Prognosis, Complicated, Systemic Symptoms, and Threat to Life/Bodily Function  Patient returns to the ED after discharge this morning.  He was just admitted for GI bleed evaluation.  Patient source of bleeding was thought to  be diverticular.  He is on aspirin and Plavix with history of PCI in April 2024.  He denies recurrent bleeding.  His hemoglobin is 9.9 on initial evaluation.  He was given 1 unit of PRBCs during his last admission after his hemoglobin dropped to 7.1.  He does have a history of CAD.  Patient denies associated chest pain.  He returns today primarily secondary to dyspnea and rapid heart rate.  On initial evaluation in the ED he is improved.  Will  obtain serial hemoglobin.  Will obtain CTA to rule out PE.  Will obtain troponin x 2 to evaluate for possible ACS.   Addendum - I was informed by nursing staff that the patient has eloped from the ED.  He did not complete his evaluation or workup.  He apparently did not inform staff that he was leaving.  Staff found the room empty with the patient's IV out.  Patient has capacity to make his own medical decisions.  Patient was informed at time of my initial evaluation that he required workup to rule out possible blood clot and / or other significant pathology.   Additional history obtained:   External records from outside sources obtained and reviewed including prior ED visits and prior Inpatient records.    Problem List / ED Course:  Dyspnea, tachycardia  Disposition:  After consideration of the diagnostic results and the patients response to treatment, I feel that the patent would benefit from completion of ED evaluation.       Final diagnoses:  Dyspnea, unspecified type    ED Discharge Orders     None          Laurice Maude BROCKS, MD 02/13/24 [phone]

## 2024-02-13 NOTE — ED Provider Triage Note (Signed)
 Emergency Medicine Provider Triage Evaluation Note  EMITT MAGLIONE , a 68 y.o. male  was evaluated in triage.  Pt complains of shortness of breath. Recently hospitalized for GI bleed, discharged earlier today. States he decided to take BP at home and HR was high, BP was low.   Review of Systems  Positive: Shortness of breath, blood in stool, lightheadedness/dizziness Negative: Fever, chills, chest pain,visual disturbances  Physical Exam  BP 111/65   Pulse (!) 123   Temp 98.2 F (36.8 C)   Resp 18   Ht 6' (1.829 m)   Wt 82.6 kg   SpO2 98%   BMI 24.68 kg/m  Gen:   Awake, no distress   Resp:  Normal effort, no obvious abnormalities with auscultation of heart or lungs  MSK:   Moves extremities without difficulty  Other:    Medical Decision Making  Medically screening exam initiated at 5:18 PM.  Appropriate orders placed.  KNOXX BOEDING was informed that the remainder of the evaluation will be completed by another provider, this initial triage assessment does not replace that evaluation, and the importance of remaining in the ED until their evaluation is complete.  Orders: CBC, BMP, CXR   Janetta Terrall F[place], [place] 02/13/24 1722

## 2024-02-13 NOTE — TOC Transition Note (Signed)
 Transition of Care [facility]) - Discharge Note   Patient Details  Name: Lonnie Bowen MRN: [id number] Date of Birth: [birth year]  [facility] CM/SW Contact:  Andrez JULIANNA George, RN Phone Number: 02/13/2024, 10:00 AM   Clinical Narrative:     Pt is discharging home with self care. No needs per TOC.  Final next level of care: Home/Self Care Barriers to Discharge: No Barriers Identified   Patient Goals and CMS Choice            Discharge Placement                       Discharge Plan and Services Additional resources added to the After Visit Summary for                                       Social Drivers of Health (SDOH) Interventions SDOH Screenings   Food Insecurity: No Food Insecurity (02/11/2024)  Housing: Low Risk  (02/11/2024)  Transportation Needs: No Transportation Needs (02/11/2024)  Utilities: Not At Risk (02/11/2024)  Financial Resource Strain: Low Risk  (04/25/2021)   Received from [facility] visits prior to 10/12/2022.  Physical Activity: Insufficiently Active (04/25/2021)   Received from [facility] visits prior to 10/12/2022.  Social Connections: Unknown (02/11/2024)  Stress: No Stress Concern Present (04/25/2021)   Received from [facility] visits prior to 10/12/2022.  Tobacco Use: Medium Risk (02/12/2024)     Readmission Risk Interventions     No data to display

## 2024-02-13 NOTE — ED Notes (Signed)
 Pt removed IV and exited ED.

## 2024-02-13 NOTE — Discharge Summary (Signed)
 [facility]  Physician Discharge Summary   Patient ID: Lonnie Bowen MRN: [id number] DOB/AGE: 68/08/1955 67 y.o.  Admit date: 02/11/2024 Discharge date: 02/13/2024    PCP: Loris Elsie PARAS, PA-C  DISCHARGE DIAGNOSES:    GIB (gastrointestinal bleeding)   Acute post-hemorrhagic anemia   Duodenitis   Barrett's esophagus without dysplasia   RECOMMENDATIONS FOR OUTPATIENT FOLLOW UP: Patient instructed to follow-up with primary care provider.  At that time referral can be made to gastroenterology for outpatient colonoscopy    Home Health: None Equipment/Devices: None  CODE STATUS: Full code  DISCHARGE CONDITION: fair  Diet recommendation: As before  INITIAL HISTORY:  68 y.o. male with medical history significant for hypertension, hyperlipidemia, type II DM, rheumatoid arthritis (has used Imuran in the past and was recently on prednisone), CAD s/p PCI in April 2024 on aspirin and Plavix.  He presented to the hospital because of rectal bleeding (passage of bright red blood stools).  He had about 2 episodes of rectal bleeding.  He felt a little lightheaded afterwards.  He said about 2 weeks prior to admission, he had an episode of rectal bleeding but it resolved spontaneously.   In the ED, he was tachycardic but normotensive.  Hemoglobin on admission was 9.9 and it dropped to 7.1.  Consultations: Gastroenterology  Procedures: EGD  HOSPITAL COURSE:   Patient presented with rectal bleeding.  Had significant drop in hemoglobin.  Was seen by gastroenterology.  Underwent EGD which showed hiatal hernia, normal esophagus, normal stomach and erythematous duodenopathy.  Patient was started diverticular bleeding.  His last colonoscopy was 4 years ago.  Bleeding appears to have subsided.  He did require 1 unit of PRBC.  Hemoglobin stable this morning.  Had a bowel movement this morning with old blood.  Feels well.  Okay for discharge.  Patient told to follow-up with his PCP and to  reestablish with gastroenterology to consider outpatient colonoscopy in the near future.  Patient with history of coronary artery disease with PCI in April 2024.  Due to GI bleeding his antiplatelet agents were held.  Patient has to discuss reinitiation of his antiplatelets with his PCP and cardiologist.  His other medical issues including rheumatoid arthritis essential hypertension and diabetes mellitus type 2 is stable.  Nephrolithiasis was incidentally noted.  Outpatient monitoring and evaluation.  Patient is stable this morning.  Okay for discharge home today.    PERTINENT LABS:  The results of significant diagnostics from this hospitalization (including imaging, microbiology, ancillary and laboratory) are listed below for reference.     Labs:   CBC: Recent Labs  Lab 02/11/24 0458 02/11/24 1401 02/12/24 0026 02/12/24 0632 02/12/24 1338 02/13/24 0549 02/13/24 1716  WBC 10.4  --   --   --   --  6.5 9.0  NEUTROABS 6.7  --   --   --   --   --  6.5  HGB 9.9*   < > 7.3* 7.3* 7.9* 9.0* 9.9*  HCT 29.6*   < > 21.9* 21.5* 23.6* 26.7* 29.0*  MCV 96.4  --   --   --   --  93.4 93.2  PLT 316  --   --   --   --  267 318   < > = values in this interval not displayed.    CBG: Recent Labs  Lab 02/11/24 2050 02/12/24 0739 02/12/24 1657 02/12/24 2016 02/13/24 0757  GLUCAP 145* 123* 164* 151* 147*     IMAGING STUDIES DG Chest 2 View  Result Date: 02/13/2024 CLINICAL DATA:  Shortness of breath EXAM: CHEST - 2 VIEW COMPARISON:  03/16/2018 FINDINGS: Heart and mediastinal contours are within normal limits. No focal opacities or effusions. No acute bony abnormality. Aortic atherosclerosis. IMPRESSION: No active cardiopulmonary disease. Electronically Signed   By: Franky Crease M.D.   On: 02/13/2024 17:37   CT ANGIO GI BLEED Result Date: 02/11/2024 CLINICAL DATA:  Lower GI bleeding.  Right red blood in stool. EXAM: CTA ABDOMEN AND PELVIS WITHOUT AND WITH CONTRAST TECHNIQUE: Multidetector  CT imaging of the abdomen and pelvis was performed using the standard protocol during bolus administration of intravenous contrast. Multiplanar reconstructed images and MIPs were obtained and reviewed to evaluate the vascular anatomy. RADIATION DOSE REDUCTION: This exam was performed according to the departmental dose-optimization program which includes automated exposure control, adjustment of the mA and/or kV according to patient size and/or use of iterative reconstruction technique. CONTRAST:  OMNIPAQUE  IOHEXOL  350 MG/ML SOLN COMPARISON:  None Available. FINDINGS: VASCULAR Aorta: Mild to moderate atherosclerosis. Normal caliber aorta without aneurysm, dissection, vasculitis or significant stenosis. Celiac: Patent without evidence of aneurysm, dissection, vasculitis or significant stenosis. SMA: Patent without evidence of aneurysm, dissection, vasculitis or significant stenosis. Replaced right hepatic artery. Renals: Focal 25-50% narrowing right renal artery. Left renal artery widely patent. IMA: Patent without evidence of aneurysm, dissection, vasculitis or significant stenosis. Inflow: Patent without evidence of aneurysm, dissection, vasculitis or significant stenosis. Proximal Outflow: Bilateral common femoral and visualized portions of the superficial and profunda femoral arteries are patent without evidence of aneurysm, dissection, vasculitis or significant stenosis. Veins: No obvious venous abnormality within the limitations of this arterial phase study. Review of the MIP images confirms the above findings. NON-VASCULAR Lower chest: No acute abnormality. Hepatobiliary: No suspicious focal abnormality within the liver parenchyma. There is no evidence for gallstones, gallbladder wall thickening, or pericholecystic fluid. No intrahepatic or extrahepatic biliary dilation. Pancreas: No focal mass lesion. No dilatation of the main duct. No intraparenchymal cyst. No peripancreatic edema. Spleen: Calcified  granulomata Adrenals/Urinary Tract: No adrenal nodule or mass. Right kidney and ureter unremarkable. 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 The 14 x 10 mm interpolar stone appears to be the site of obstruction for the distended upper pole calyx. Left ureter unremarkable. Bladder is decompressed. Stomach/Bowel: Stomach is unremarkable. No gastric wall thickening. No evidence of outlet obstruction. No contrast extravasation into the gastric lumen to suggest active GI bleeding. Duodenum is normally positioned as is the ligament of Treitz. Duodenal diverticulum noted. No contrast accumulation in lumen of the duodenum or small bowel to suggest active hemorrhage. The terminal ileum is normal. The appendix is normal. Diverticuli are seen scattered along the entire length of the colon without CT findings of diverticulitis. No contrast extravasation into the lumen of the colon to suggest active hemorrhage. Subcentimeter foci of fat density in the mid transverse colon may reflect small lipomas (image 33 and 37 of series 4). Lymphatic: There is no gastrohepatic or hepatoduodenal ligament lymphadenopathy. No retroperitoneal or mesenteric lymphadenopathy. No pelvic sidewall lymphadenopathy. Reproductive: The prostate gland and seminal vesicles are unremarkable. Other: No intraperitoneal free fluid. Musculoskeletal: No worrisome lytic or sclerotic osseous abnormality. IMPRESSION: 1. No contrast extravasation into the lumen of the stomach, small bowel, or colon to suggest active GI bleeding. 2. Dilated left upper pole renal calyx with multiple left-sided renal stones  measuring up to 14 x 10 mm in the interpolar region and 16 x 10 mm in the lower pole. The 14 x 10 mm interpolar stone appears to be the site of obstruction for the distended upper pole calyx. Urology consultation recommended 3.  Aortic  Atherosclerosis (ICD10-I70.0). Electronically Signed   By: Camellia Candle M.D.   On: 02/11/2024 06:40    DISCHARGE EXAMINATION: Vitals:   02/12/24 1759 02/12/24 2014 02/13/24 0421 02/13/24 0758  BP: 114/67 125/75 117/72 (!) 140/71  Pulse: 97 91 87 91  Resp: 16 18 18    Temp: 97.9 F (36.6 C) 98.1 F (36.7 C) 98 F (36.7 C) 97.8 F (36.6 C)  TempSrc: Oral Oral Oral Oral  SpO2: 97% 97% 95% 98%  Weight:      Height:       General appearance: Awake alert.  In no distress Resp: Clear to auscultation bilaterally.  Normal effort Cardio: S1-S2 is normal regular.  No S3-S4.  No rubs murmurs or bruit GI: Abdomen is soft.  Nontender nondistended.  Bowel sounds are present normal.  No masses organomegaly Extremities: No edema.  Full range of motion of lower extremities. Neurologic: Alert and oriented x3.  No focal neurological deficits.    DISPOSITION: Home  Discharge Instructions     Call MD for:  difficulty breathing, headache or visual disturbances   Complete by: As directed    Call MD for:  extreme fatigue   Complete by: As directed    Call MD for:  persistant dizziness or light-headedness   Complete by: As directed    Call MD for:  persistant nausea and vomiting   Complete by: As directed    Call MD for:  severe uncontrolled pain   Complete by: As directed    Call MD for:  temperature >100.4   Complete by: As directed    Diet - low sodium heart healthy   Complete by: As directed    Discharge instructions   Complete by: As directed    Please hold your Plavix for 3 days and then call your primary care provider or cardiologist on Monday to ask if it needs to be resumed or switched over to aspirin.  Please see your primary care provider within 1 week to undergo blood work to check your hemoglobin and your renal function.  Seek attention if your bleeding recurs.  You were cared for by a hospitalist during your hospital stay. If you have any questions about your discharge  medications or the care you received while you were in the hospital after you are discharged, you can call the unit and asked to speak with the hospitalist on call if the hospitalist that took care of you is not available. Once you are discharged, your primary care physician will handle any further medical issues. Please note that NO REFILLS for any discharge medications will be authorized once you are discharged, as it is imperative that you return to your primary care physician (or establish a relationship with a primary care physician if you do not have one) for your aftercare needs so that they can reassess your need for medications and monitor your lab values. If you do not have a primary care physician, you can call [phone] for a physician referral.   Increase activity slowly   Complete by: As directed          Allergies as of 02/13/2024       Reactions   Cipro [ciprofloxacin Hcl] Nausea Only  Medication List     STOP taking these medications    clopidogrel 75 MG tablet Commonly known as: PLAVIX       TAKE these medications    amLODipine -valsartan  5-160 MG tablet Commonly known as: EXFORGE  Take 1 tablet by mouth daily.   COD LIVER OIL PO Take 1 capsule by mouth daily.   folic acid  1 MG tablet Commonly known as: FOLVITE  Take 1 mg by mouth daily.   Ginger 500 MG Caps Take 1 capsule by mouth daily at 12 noon.   hydroxychloroquine  200 MG tablet Commonly known as: PLAQUENIL  Take 400 mg by mouth daily.   melatonin 5 MG Tabs Take 5 mg by mouth at bedtime.   metFORMIN 500 MG tablet Commonly known as: GLUCOPHAGE Take 500 mg by mouth every evening.   methotrexate 2.5 MG tablet Commonly known as: RHEUMATREX Take 12.5 mg by mouth once a week. Wednesday   multivitamin with minerals Tabs tablet Take 1 tablet by mouth daily.   pantoprazole  40 MG tablet Commonly known as: Protonix  Take 1 tablet (40 mg total) by mouth daily.   predniSONE 5 MG  tablet Commonly known as: DELTASONE Take 10 mg by mouth daily.   QC TUMERIC COMPLEX PO Take 1 tablet by mouth daily.   tamsulosin  0.4 MG Caps capsule Commonly known as: FLOMAX  Take 0.4 mg by mouth daily after supper.   VITAMIN K2-VITAMIN D3 PO Take 1 capsule by mouth daily at 12 noon.          Follow-up Information     Loris Elsie PARAS, PA-C. Schedule an appointment as soon as possible for a visit in 1 week(s).   Specialty: Physician Assistant Why: post hospitalization follow up. check CBC and BMET blood work Contact information: [address] [place] [address] [phone]                 TOTAL DISCHARGE TIME: 35 minutes  Miquela Costabile Verdene  [facility] Pager on [email or web address]  02/15/2024, 9:44 AM

## 2024-02-14 ENCOUNTER — Encounter (HOSPITAL_COMMUNITY): Payer: Self-pay | Admitting: Gastroenterology

## 2024-02-14 NOTE — ED Notes (Signed)
 Pt eloped out of the ED prior to Ed provider been able to reassess him or RN to be able to draw his blood.

## 2024-02-16 LAB — SURGICAL PATHOLOGY

## 2024-02-16 NOTE — Telephone Encounter (Signed)
 Pt was in ED recently has Gastrointestinal bleeding that the ED Dr believes is from Clopidogrel please advise.

## 2024-02-16 NOTE — Care Management Important Message (Signed)
 Important Message  Patient Details  Name: Lonnie Bowen MRN: [id number] Date of Birth: [birth year]   Important Message Given:  Yes - Medicare IM  Patient left prior to IM delivery will mail to the patient home address.    Lonnie Bowen 02/16/2024, 8:58 AM

## 2024-02-17 NOTE — Telephone Encounter (Signed)
 Triage:  Message left instructing to contact clinic as needed.

## 2024-02-17 NOTE — Telephone Encounter (Signed)
 Pt calling back and would like to know if Dr. Toribio has looked over his ED report to advise with concerns in previous message about taking Clopidogrel? Pt request call back.  Cb# [phone]

## 2024-02-17 NOTE — Telephone Encounter (Signed)
 Pt returning call to [employer].  Cb# [phone]

## 2024-02-18 ENCOUNTER — Ambulatory Visit: Payer: Self-pay | Admitting: Gastroenterology

## 2024-08-30 ENCOUNTER — Emergency Department (HOSPITAL_COMMUNITY): Payer: PRIVATE HEALTH INSURANCE

## 2024-08-30 ENCOUNTER — Emergency Department (HOSPITAL_COMMUNITY)
Admission: EM | Admit: 2024-08-30 | Discharge: 2024-08-30 | Disposition: A | Discharge status: Home / Self Care | Payer: PRIVATE HEALTH INSURANCE | Attending: Emergency Medicine | Admitting: Emergency Medicine

## 2024-08-30 ENCOUNTER — Encounter (HOSPITAL_COMMUNITY): Payer: Self-pay

## 2024-08-30 ENCOUNTER — Other Ambulatory Visit: Payer: Self-pay

## 2024-08-30 DIAGNOSIS — M542 Cervicalgia: Secondary | ICD-10-CM | POA: Diagnosis present

## 2024-08-30 DIAGNOSIS — M436 Torticollis: Secondary | ICD-10-CM | POA: Diagnosis not present

## 2024-08-30 MED ORDER — ACETAMINOPHEN 500 MG PO TABS
1000.0000 mg | ORAL_TABLET | Freq: Once | ORAL | Status: AC
  Administered 2024-08-30: 1000 mg via ORAL
  Filled 2024-08-30: qty 2

## 2024-08-30 MED ORDER — DIAZEPAM 5 MG PO TABS
5.0000 mg | ORAL_TABLET | Freq: Once | ORAL | Status: AC
  Administered 2024-08-30: 5 mg via ORAL
  Filled 2024-08-30: qty 1

## 2024-08-30 MED ORDER — KETOROLAC TROMETHAMINE 10 MG PO TABS: 10.0000 mg | ORAL_TABLET | Freq: Three times a day (TID) | ORAL | 0 refills | Status: AC | PRN

## 2024-08-30 MED ORDER — IBUPROFEN 400 MG PO TABS
600.0000 mg | ORAL_TABLET | Freq: Once | ORAL | Status: AC
  Administered 2024-08-30: 600 mg via ORAL
  Filled 2024-08-30: qty 1

## 2024-08-30 MED ORDER — LIDOCAINE 5 % EX PTCH
1.0000 | MEDICATED_PATCH | CUTANEOUS | Status: DC
  Administered 2024-08-30: 1 via TRANSDERMAL
  Filled 2024-08-30: qty 1

## 2024-08-30 MED ORDER — DIAZEPAM 5 MG PO TABS: 5.0000 mg | ORAL_TABLET | Freq: Two times a day (BID) | ORAL | 0 refills | Status: AC | PRN

## 2024-08-30 MED ORDER — PREDNISONE 10 MG PO TABS: 10.0000 mg | ORAL_TABLET | Freq: Every day | ORAL | 0 refills | Status: AC

## 2024-08-30 NOTE — Discharge Instructions (Signed)
 You have been seen and discharged from the emergency department.  You were diagnosed with cervical spine muscle spasm/torticollis.  Stay well-hydrated.  Avoid any strenuous activity for the next 48 hours.  You may use ice and heat on the area.  Take anti-inflammatory pain medicine as needed.  Use muscle relaxer medicine as indicated.  Do not mix this medication with alcohol or other sedating medications. Do not drive or do heavy physical activity until you know how this medication affects you.  It may cause drowsiness.  Follow-up with your primary provider for further evaluation and further care. Take home medications as prescribed. If you have any worsening symptoms or further concerns for your health please return to an emergency department for further evaluation.

## 2024-08-30 NOTE — ED Notes (Signed)
 Pt provided with discharge and follow up instructions, medications discussed, pt verbalized understanding. VSS, pt ambulatory out of ED w/ all paperwork and belongings in NAD.

## 2024-08-30 NOTE — ED Notes (Signed)
Pt provided with crackers per request 

## 2024-08-30 NOTE — ED Triage Notes (Signed)
 Patient here with complaints of a stiff and sore neck. He states that the pain started as sciatica about a week and a half ago and then the pain has moved up his back. He has had neck pain for about a week now and the pain is progressively getting worse.

## 2024-08-30 NOTE — ED Provider Notes (Signed)
 " [facility] Provider Note   CSN: [id number] Arrival date & time: 08/30/24  1208     Patient presents with: Torticollis (/)   Lonnie Bowen is a 69 y.o. male.   HPI   69 year old male presents emergency department concern for left-sided neck spasming.  Patient sciatica has been flaring up for the past week.  Because of this he had to sleep somewhat sitting up in his couch.  Following this he woke up with left-sided neck stiffness that was manageable yesterday but worse overnight and more severe today.  Not relieved with over-the-counter medications.  Is unable to turn or move his neck secondary to neck spasm.  Otherwise denies any fever or neuro symptoms.  Prior to Admission medications  Medication Sig Start Date End Date Taking? Authorizing Provider  amLODipine -valsartan  (EXFORGE ) 5-160 MG tablet Take 1 tablet by mouth daily. 11/24/23   [provider]  COD LIVER OIL PO Take 1 capsule by mouth daily.    [provider]  folic acid  (FOLVITE ) 1 MG tablet Take 1 mg by mouth daily. 12/10/23   [provider]  Ginger 500 MG CAPS Take 1 capsule by mouth daily at 12 noon.    [provider]  hydroxychloroquine  (PLAQUENIL ) 200 MG tablet Take 400 mg by mouth daily.    [provider]  melatonin 5 MG TABS Take 5 mg by mouth at bedtime.    [provider]  metFORMIN (GLUCOPHAGE) 500 MG tablet Take 500 mg by mouth every evening.    [provider]  methotrexate (RHEUMATREX) 2.5 MG tablet Take 12.5 mg by mouth once a week. Wednesday 12/10/23   [provider]  Multiple Vitamin (MULTIVITAMIN WITH MINERALS) TABS Take 1 tablet by mouth daily.    [provider]  pantoprazole  (PROTONIX ) 40 MG tablet Take 1 tablet (40 mg total) by mouth daily. 02/13/24 02/12/25  Krishnan, Gokul, MD  predniSONE  (DELTASONE ) 5 MG tablet Take 10 mg by mouth daily. 12/27/23   [provider]   tamsulosin  (FLOMAX ) 0.4 MG CAPS capsule Take 0.4 mg by mouth daily after supper. 01/13/24   [provider]  Turmeric (QC TUMERIC COMPLEX PO) Take 1 tablet by mouth daily.    [provider]  Vitamin D-Vitamin K (VITAMIN K2-VITAMIN D3 PO) Take 1 capsule by mouth daily at 12 noon.    [provider]    Allergies: Cipro [ciprofloxacin hcl]    Review of Systems  Constitutional:  Negative for chills and fever.  Respiratory:  Negative for shortness of breath.   Cardiovascular:  Negative for chest pain.  Gastrointestinal:  Negative for abdominal pain, diarrhea and vomiting.  Musculoskeletal:  Positive for neck pain.       Left-sided neck muscle spasm  Skin:  Negative for rash.  Neurological:  Negative for weakness, numbness and headaches.    Updated Vital Signs BP (!) 154/91   Pulse 92   Temp 98 F (36.7 C) (Oral)   Resp 16   Ht 6' (1.829 m)   Wt 80.7 kg   SpO2 99%   BMI 24.14 kg/m   Physical Exam Vitals and nursing note reviewed.  Constitutional:      General: He is not in acute distress.    Appearance: Normal appearance. He is not ill-appearing.  HENT:     Head: Normocephalic.     Mouth/Throat:     Mouth: Mucous membranes are moist.  Neck:  Comments: Patient is unable to turn his head to the left secondary to palpable muscular spasm in the paraspinal/trapezius muscle, Lidoderm  patch in place, no midline spinal tenderness, upper extremities neurovascularly intact Cardiovascular:     Rate and Rhythm: Normal rate.  Pulmonary:     Effort: Pulmonary effort is normal. No respiratory distress.  Abdominal:     Palpations: Abdomen is soft.     Tenderness: There is no abdominal tenderness.  Skin:    General: Skin is warm.  Neurological:     Mental Status: He is alert and oriented to person, place, and time. Mental status is at baseline.  Psychiatric:        Mood and Affect: Mood normal.     (all labs ordered are listed, but only abnormal  results are displayed) Labs Reviewed - No data to display  EKG: None  Radiology: DG Cervical Spine Complete Result Date: 08/30/2024 EXAM: 6 VIEW(S) XRAY OF THE CERVICAL SPINE 08/30/2024 01:44:00 PM COMPARISON: Prior study dated 02/13/2024. CLINICAL HISTORY: Left sided neck pain, stiffness. FINDINGS: BONES: Vertebral body heights are maintained. Alignment is normal. Bony demineralization resulting in mildly reduced sensitivity. No acute fracture or static instability identified. Subtle anterior wedging of the T3 vertebral body, not changed from 02/13/2024. DISCS AND DEGENERATIVE CHANGES: Intervertebral disc height at C3-C4 with posterior osseous ridging between the vertebral bodies at C3-C4 and C5-C6. Uncinate spurring noted on the left at C4-C5 and C5-C6, with mild right uncinate spurring at C4-C5, C5-C6, and C6-C7, but without overt osseous foraminal stenosis readily apparent. There is mild levoconvex cervical scoliosis on the frontal projection. SOFT TISSUES: No prevertebral soft tissue swelling. The visualized lungs appear clear. Atheromatous vascular calcification of the aortic arch. IMPRESSION: 1. No acute fracture or static instability identified. 2. Chronic T3 vertebral body wedging. 3. Disc height loss at C3-4 with posterior osseous ridging at C3-4 and C5-6. 4. Uncinate spurring at C4-5 and C5-6, greater on the left, with mild right uncinate spurring at C4-5, C5-6, and C6-7, without overt osseous foraminal stenosis. 5. Mild levoconvex cervical scoliosis. 6. Bony demineralization with mildly reduced sensitivity. 7. Atheromatous vascular calcification of the aortic arch. Electronically signed by: Ryan Salvage MD 08/30/2024 02:56 PM EST RP Workstation: [id number]     Procedures   Medications Ordered in the ED  lidocaine  (LIDODERM ) 5 % 1 patch (1 patch Transdermal Patch Applied 08/30/24 1329)  diazepam  (VALIUM ) tablet 5 mg (has no administration in time range)  ibuprofen  (ADVIL ) tablet 600  mg (has no administration in time range)  acetaminophen  (TYLENOL ) tablet 1,000 mg (1,000 mg Oral Given 08/30/24 1329)                                    Medical Decision Making Risk Prescription drug management.   69 year old male presents emergency department what appears to be muscle spasm/torticollis in the left side of the neck.  X-ray shows degenerative disease without any acute findings.  Patient is neuro intact, afebrile, not meningitic.  After a dose of pain medicine and Valium  patient's symptoms have improved.  He now has some range of motion of the neck.  He feels improved.  Will continue symptomatic therapy and plan for outpatient follow-up.  Patient at this time appears safe and stable for discharge and close outpatient follow up. Discharge plan and strict return to ED precautions discussed, patient verbalizes understanding and agreement.     Final diagnoses:  None  ED Discharge Orders     None          Bari Roxie HERO, DO 08/30/24 1931  "

## 2024-08-30 NOTE — ED Provider Triage Note (Signed)
 Emergency Medicine Provider Triage Evaluation Note  Lonnie Bowen , a 69 y.o. male  was evaluated in triage.  Pt complains of left-sided neck pain and stiffness.  Patient has a history of rheumatoid arthritis and states that for the past week he has had her left-sided neck pain as well as stiffness.  Believed to be torticollis.  No recent history of same.  Denies trauma/injury.  Review of Systems  Positive: Neck pain Negative: Trauma/injury  Physical Exam  BP (!) 163/87   Pulse (!) 112   Temp (!) 97.4 F (36.3 C)   Resp 18   Ht 6' (1.829 m)   Wt 80.7 kg   SpO2 99%   BMI 24.14 kg/m  Gen:   Awake, no distress   Resp:  Normal effort  MSK:   Moves extremities without difficulty  Other:  Left side of neck over sternocleidomastoid area tender with palpation, no obvious wounds to head or neck, no distinct cervical spine pain, no step-off deformity  Medical Decision Making  Medically screening exam initiated at 1:07 PM.  Appropriate orders placed.  ADLEY MAZUROWSKI was informed that the remainder of the evaluation will be completed by another provider, this initial triage assessment does not replace that evaluation, and the importance of remaining in the ED until their evaluation is complete.  Orders: Cervical spine x-ray, Tylenol , lidocaine  patch   Janetta Terrall FALCON, PA-C 08/30/24 1308
